# Patient Record
Sex: Female | Born: 1950 | Race: White | Hispanic: No | Marital: Single | Smoking: Former smoker
Health system: Southern US, Community
[De-identification: ages and names within clinical notes are randomized; demographics above are authoritative.]

## PROBLEM LIST (undated history)

## (undated) DIAGNOSIS — C4490 Unspecified malignant neoplasm of skin, unspecified: Secondary | ICD-10-CM

## (undated) DIAGNOSIS — I10 Essential (primary) hypertension: Secondary | ICD-10-CM

## (undated) DIAGNOSIS — T7840XA Allergy, unspecified, initial encounter: Secondary | ICD-10-CM

## (undated) DIAGNOSIS — M1990 Unspecified osteoarthritis, unspecified site: Secondary | ICD-10-CM

## (undated) HISTORY — DX: Allergy, unspecified, initial encounter: T78.40XA

## (undated) HISTORY — DX: Unspecified osteoarthritis, unspecified site: M19.90

## (undated) HISTORY — DX: Essential (primary) hypertension: I10

## (undated) HISTORY — PX: FRACTURE SURGERY: SHX138

## (undated) HISTORY — DX: Unspecified malignant neoplasm of skin, unspecified: C44.90

## (undated) HISTORY — PX: EYE SURGERY: SHX253

---

## 2010-09-22 HISTORY — PX: FOOT SURGERY: SHX648

## 2016-08-14 LAB — BASIC METABOLIC PANEL
Creatinine: 0.7 (ref <=1.1)
GLUCOSE: 95
POTASSIUM: 4.5 (ref 3.4–5.3)
Sodium: 145 (ref 137–147)

## 2016-08-14 LAB — LIPID PANEL
CHOLESTEROL: 218 — AB (ref 0–200)
HDL: 47 (ref 35–70)
LDL CALC: 137
Triglycerides: 169 — AB (ref 40–160)

## 2016-08-14 LAB — HEPATIC FUNCTION PANEL
ALT: 14 (ref 7–35)
AST: 16 (ref 13–35)
Bilirubin, Direct: 0.4

## 2017-07-09 DIAGNOSIS — M79672 Pain in left foot: Secondary | ICD-10-CM | POA: Diagnosis not present

## 2017-07-09 DIAGNOSIS — Z9889 Other specified postprocedural states: Secondary | ICD-10-CM | POA: Diagnosis not present

## 2017-07-28 ENCOUNTER — Encounter: Payer: Self-pay | Admitting: Family Medicine

## 2017-07-28 ENCOUNTER — Ambulatory Visit: Payer: PPO | Admitting: Family Medicine

## 2017-07-28 VITALS — BP 138/78 | HR 87 | Temp 98.1°F | Ht 66.0 in | Wt 182.3 lb

## 2017-07-28 DIAGNOSIS — L03211 Cellulitis of face: Secondary | ICD-10-CM | POA: Insufficient documentation

## 2017-07-28 DIAGNOSIS — I10 Essential (primary) hypertension: Secondary | ICD-10-CM | POA: Diagnosis not present

## 2017-07-28 DIAGNOSIS — M79673 Pain in unspecified foot: Secondary | ICD-10-CM | POA: Insufficient documentation

## 2017-07-28 DIAGNOSIS — Z8669 Personal history of other diseases of the nervous system and sense organs: Secondary | ICD-10-CM | POA: Diagnosis not present

## 2017-07-28 DIAGNOSIS — J302 Other seasonal allergic rhinitis: Secondary | ICD-10-CM | POA: Diagnosis not present

## 2017-07-28 DIAGNOSIS — Z0000 Encounter for general adult medical examination without abnormal findings: Secondary | ICD-10-CM | POA: Diagnosis not present

## 2017-07-28 DIAGNOSIS — Z131 Encounter for screening for diabetes mellitus: Secondary | ICD-10-CM

## 2017-07-28 DIAGNOSIS — Z13220 Encounter for screening for lipoid disorders: Secondary | ICD-10-CM | POA: Diagnosis not present

## 2017-07-28 DIAGNOSIS — L259 Unspecified contact dermatitis, unspecified cause: Secondary | ICD-10-CM | POA: Insufficient documentation

## 2017-07-28 LAB — COMPREHENSIVE METABOLIC PANEL
ALBUMIN: 4.3 g/dL (ref 3.5–5.2)
ALT: 15 U/L (ref 0–35)
AST: 16 U/L (ref 0–37)
Alkaline Phosphatase: 101 U/L (ref 39–117)
BILIRUBIN TOTAL: 0.4 mg/dL (ref 0.2–1.2)
BUN: 14 mg/dL (ref 6–23)
CALCIUM: 10.2 mg/dL (ref 8.4–10.5)
CO2: 28 mEq/L (ref 19–32)
Chloride: 104 mEq/L (ref 96–112)
Creatinine, Ser: 0.74 mg/dL (ref 0.40–1.20)
GFR: 83.67 mL/min (ref >60.00)
Glucose, Bld: 90 mg/dL (ref 70–99)
Potassium: 4.5 mEq/L (ref 3.5–5.1)
Sodium: 139 mEq/L (ref 135–145)
Total Protein: 7.2 g/dL (ref 6.0–8.3)

## 2017-07-28 LAB — CBC WITH DIFFERENTIAL/PLATELET
BASOS ABS: 0.1 10*3/uL (ref 0.0–0.1)
Basophils Relative: 1.4 % (ref 0.0–3.0)
EOS ABS: 0.1 10*3/uL (ref 0.0–0.7)
Eosinophils Relative: 1.7 % (ref 0.0–5.0)
HCT: 42.2 % (ref 36.0–46.0)
HEMOGLOBIN: 13.7 g/dL (ref 12.0–15.0)
LYMPHS PCT: 31.5 % (ref 12.0–46.0)
Lymphs Abs: 2.2 10*3/uL (ref 0.7–4.0)
MCHC: 32.5 g/dL (ref 30.0–36.0)
MCV: 85.6 fl (ref 78.0–100.0)
MONO ABS: 0.7 10*3/uL (ref 0.1–1.0)
Monocytes Relative: 9.5 % (ref 3.0–12.0)
Neutro Abs: 3.9 10*3/uL (ref 1.4–7.7)
Neutrophils Relative %: 55.9 % (ref 43.0–77.0)
Platelets: 258 10*3/uL (ref 150.0–400.0)
RBC: 4.93 Mil/uL (ref 3.87–5.11)
RDW: 17.6 % — ABNORMAL HIGH (ref 11.5–15.5)
WBC: 7 10*3/uL (ref 4.0–10.5)

## 2017-07-28 LAB — LIPID PANEL
CHOLESTEROL: 252 mg/dL — AB (ref 0–200)
HDL: 45.5 mg/dL (ref >39.00)
LDL CALC: 177 mg/dL — AB (ref 0–99)
NonHDL: 206.14
TRIGLYCERIDES: 145 mg/dL (ref 0.0–149.0)
Total CHOL/HDL Ratio: 6
VLDL: 29 mg/dL (ref 0.0–40.0)

## 2017-07-28 LAB — HEMOGLOBIN A1C: HEMOGLOBIN A1C: 5.7 % (ref 4.6–6.5)

## 2017-07-28 NOTE — Progress Notes (Addendum)
Subjective:    Maria Love is a 66 y.o. female who presents for a Welcome to Medicare exam.   Patient recently moved to [place] in mid-June from [place]. She is retired Energy manager. She is currently sharing apartment with one of her good friends that lives in the area. Patient is interested in joining [employer]. She inquires about deep water aerobics as she use to do this in [place].  HTN: -taking metoprolol -dx'd 8 yrs ago -not currently checking bp at home -denies HA, CP, changes in vision.  Seasonal allergies: -using a homeopathic sinus rinse (sinusalia?) and a tree pollen suspension in alcohol.  H/o migraines: -started in 1970s after a skull fx s/p kick in the head from a horse -have decreased in frequency.  Review of Systems General: Denies fever, chills, night sweats, changes in weight, changes in appetite HEENT: Denies headaches, ear pain, changes in vision, rhinorrhea, sore throat CV: Denies CP, palpitations, SOB, orthopnea Pulm: Denies SOB, cough, wheezing GI: Denies abdominal pain, nausea, vomiting, diarrhea, constipation GU: Denies dysuria, hematuria, frequency, vaginal discharge Msk: Denies muscle cramps, joint pains Neuro: Denies weakness, numbness, tingling Skin: Denies rashes, bruising Psych: Denies depression, anxiety, hallucinations         Objective:    Today's Vitals   07/28/17 1300  BP: 138/78  Pulse: 87  Temp: 98.1 F (36.7 C)  TempSrc: Oral  Weight: 182 lb 4.8 oz (82.7 kg)  Height: 5\' 6"  (1.676 m)  Body mass index is 29.42 kg/m.  Medications Outpatient Encounter Medications as of 07/28/2017  Medication Sig  . HAWTHORN PO Take by mouth.  . metoprolol succinate (TOPROL-XL) 25 MG 24 hr tablet   . Multiple Vitamin (MULTIVITAMIN) tablet Take 1 tablet daily by mouth.   No facility-administered encounter medications on file as of 07/28/2017.      History: Past Medical History:  Diagnosis Date  . Hypertension     History reviewed. No pertinent surgical history.  Family History  Problem Relation Age of Onset  . Arthritis Mother   . Hypertension Mother   . Hyperlipidemia Mother   . Mitral valve prolapse Mother   . Pancreatic cancer Father   . Arthritis Sister   . Thyroid cancer Son   . Arthritis Maternal Grandmother   . Hypertension Maternal Grandmother   . Diabetes Maternal Grandfather   . Arthritis Paternal Grandmother   . Macular degeneration Paternal Grandmother    Social History   Occupational History  . Not on file  Tobacco Use  . Smoking status: Former Research scientist (life sciences)  . Smokeless tobacco: Never Used  Substance and Sexual Activity  . Alcohol use: No    Frequency: Never  . Drug use: No  . Sexual activity: Not on file    Tobacco Counseling Counseling given: Pt is a former smoker.  Smoked for about 1 yr in college and has not smoked since.   Immunizations and Health Maintenance Immunization History  Administered Date(s) Administered  . Tdap 09/22/2008  . Zoster 09/23/2011   Health Maintenance Due  Topic Date Due  . Hepatitis C Screening  Mar 15, 1951  . HIV Screening  06/03/1967  . TETANUS/TDAP  06/03/1971  . PAP SMEAR  06/02/1973  . MAMMOGRAM  06/02/2002  . COLONOSCOPY  06/02/2002  . DEXA SCAN  06/02/2017    Activities of Daily Living No flowsheet data found.  Able to perform all  ADLs without assistance. Walks and takes the bus, as she sold her care before moving from [place].  Physical Exam  Gen. Pleasant, well developed, well-nourished, in NAD HEENT - [place]/AT, PERRL, no scleral icterus, no nasal drainage, pharynx without erythema or exudate. Neck: No JVD, no thyromegaly, no carotid bruits Lungs: no use of accessory muscles, CTAB, no wheezes, rales or rhonchi Cardiovascular: RRR, No r/g/m, no peripheral edema Abdomen: BS present, soft, nontender,nondistended Musculoskeletal: No deformities, moves all four extremities, no cyanosis or clubbing, normal tone Neuro:  A&Ox3,  CN II-XII intact, normal gait Skin:  Warm, dry, intact, no lesions Psych: normal affect, mood appropriate     (optional), or other factors deemed appropriate based on the beneficiary's medical and social history and current clinical standards.      Assessment:    This is a routine wellness examination for this patient .   Vision/Hearing screen No exam data present  Recent vision exam.  Had cataract removal on L eye in March 2018.  R eye was done Aug 2016.  Dietary issues and exercise activities discussed:  Yes.  Pt is planning to join [employer].  She is interested in doing deep water aerobics.  She is currently walking more and taking the bus.  Pt endorses healthy eating habits.    Goals    None     Depression Screen PHQ 2/9 Scores 07/28/2017  PHQ - 2 Score 0     Fall Risk Fall Risk  07/28/2017  Falls in the past year? No    Cognitive Function:  appropriate, no issues.      Patient Care Team: Billie Ruddy, MD as PCP - General (Family Medicine)     Plan:   Pt is a 66 yo female with pmh sig HTN and seasonal allergies.  I have personally reviewed and noted the following in the patient's chart:   . Medical and social history . Use of alcohol, tobacco or illicit drugs  . Current medications and supplements . Functional ability and status . Nutritional status . Physical activity . Advanced directives . List of other physicians . Hospitalizations, surgeries, and ER visits in previous 12 months . Vitals . Screenings to include cognitive, depression, and falls . Referrals and appointments  In addition, I have reviewed and discussed with patient certain preventive protocols, quality metrics, and best practice recommendations. A written personalized care plan for preventive services as well as general preventive health recommendations were provided to patient.   Will send to lab for CBC, CMP, Hgb A1C, Lipid panel. Given handout to schedule  mammogram.  Billie Ruddy, MD 07/28/2017

## 2017-07-28 NOTE — Patient Instructions (Addendum)
Preventive Care 65 Years and Older, Female Preventive care refers to lifestyle choices and visits with your health care provider that can promote health and wellness. What does preventive care include?  A yearly physical exam. This is also called an annual well check.  Dental exams once or twice a year.  Routine eye exams. Ask your health care provider how often you should have your eyes checked.  Personal lifestyle choices, including: ? Daily care of your teeth and gums. ? Regular physical activity. ? Eating a healthy diet. ? Avoiding tobacco and drug use. ? Limiting alcohol use. ? Practicing safe sex. ? Taking low-dose aspirin every day. ? Taking vitamin and mineral supplements as recommended by your health care provider. What happens during an annual well check? The services and screenings done by your health care provider during your annual well check will depend on your age, overall health, lifestyle risk factors, and family history of disease. Counseling Your health care provider may ask you questions about your:  Alcohol use.  Tobacco use.  Drug use.  Emotional well-being.  Home and relationship well-being.  Sexual activity.  Eating habits.  History of falls.  Memory and ability to understand (cognition).  Work and work environment.  Reproductive health.  Screening You may have the following tests or measurements:  Height, weight, and BMI.  Blood pressure.  Lipid and cholesterol levels. These may be checked every 5 years, or more frequently if you are over 50 years old.  Skin check.  Lung cancer screening. You may have this screening every year starting at age 55 if you have a 30-pack-year history of smoking and currently smoke or have quit within the past 15 years.  Fecal occult blood test (FOBT) of the stool. You may have this test every year starting at age 50.  Flexible sigmoidoscopy or colonoscopy. You may have a sigmoidoscopy every 5 years or  a colonoscopy every 10 years starting at age 50.  Hepatitis C blood test.  Hepatitis B blood test.  Sexually transmitted disease (STD) testing.  Diabetes screening. This is done by checking your blood sugar (glucose) after you have not eaten for a while (fasting). You may have this done every 1-3 years.  Bone density scan. This is done to screen for osteoporosis. You may have this done starting at age 65.  Mammogram. This may be done every 1-2 years. Talk to your health care provider about how often you should have regular mammograms.  Talk with your health care provider about your test results, treatment options, and if necessary, the need for more tests. Vaccines Your health care provider may recommend certain vaccines, such as:  Influenza vaccine. This is recommended every year.  Tetanus, diphtheria, and acellular pertussis (Tdap, Td) vaccine. You may need a Td booster every 10 years.  Varicella vaccine. You may need this if you have not been vaccinated.  Zoster vaccine. You may need this after age 60.  Measles, mumps, and rubella (MMR) vaccine. You may need at least one dose of MMR if you were born in 1957 or later. You may also need a second dose.  Pneumococcal 13-valent conjugate (PCV13) vaccine. One dose is recommended after age 65.  Pneumococcal polysaccharide (PPSV23) vaccine. One dose is recommended after age 65.  Meningococcal vaccine. You may need this if you have certain conditions.  Hepatitis A vaccine. You may need this if you have certain conditions or if you travel or work in places where you may be exposed to hepatitis   A.  Hepatitis B vaccine. You may need this if you have certain conditions or if you travel or work in places where you may be exposed to hepatitis B.  Haemophilus influenzae type b (Hib) vaccine. You may need this if you have certain conditions.  Talk to your health care provider about which screenings and vaccines you need and how often you  need them. This information is not intended to replace advice given to you by your health care provider. Make sure you discuss any questions you have with your health care provider. Document Released: 10/05/2015 Document Revised: 05/28/2016 Document Reviewed: 07/10/2015 Elsevier Interactive Patient Education  2017 [employer].

## 2017-08-11 ENCOUNTER — Encounter: Payer: Self-pay | Admitting: Family Medicine

## 2017-08-17 NOTE — Progress Notes (Addendum)
error 

## 2017-09-04 ENCOUNTER — Telehealth: Payer: Self-pay | Admitting: Family Medicine

## 2017-09-04 ENCOUNTER — Other Ambulatory Visit: Payer: Self-pay | Admitting: Emergency Medicine

## 2017-09-04 ENCOUNTER — Other Ambulatory Visit: Payer: Self-pay

## 2017-09-04 MED ORDER — METOPROLOL SUCCINATE ER 25 MG PO TB24: 25.0000 mg | ORAL_TABLET | Freq: Every day | ORAL | 1 refills | Status: DC

## 2017-09-04 NOTE — Telephone Encounter (Signed)
Medication has been sent to patient pharmacy. Nothing further needed.

## 2017-09-04 NOTE — Telephone Encounter (Signed)
That's fine

## 2017-09-04 NOTE — Telephone Encounter (Signed)
Toprol XL needs refill. This has not been refilled before by this office. Will send to provider.

## 2017-09-04 NOTE — Telephone Encounter (Signed)
Is it okay to refill patient metoprolol?

## 2017-09-04 NOTE — Telephone Encounter (Signed)
Copied from [place]. Topic: Quick Communication - Rx Refill/Question >> Sep 04, 2017  9:18 AM Synthia Innocent wrote: Has the patient contacted their pharmacy? Yes.     (Agent: If no, request that the patient contact the pharmacy for the refill.)   Preferred Pharmacy (with phone number or street name):[facility] on [address]   Agent: Please be advised that RX refills may take up to 3 business days. We ask that you follow-up with your pharmacy. Requesting refill on metoprolol succinate (TOPROL-XL) 25 MG 24 hr tablet

## 2018-02-24 DIAGNOSIS — M25572 Pain in left ankle and joints of left foot: Secondary | ICD-10-CM | POA: Diagnosis not present

## 2018-03-10 DIAGNOSIS — M25572 Pain in left ankle and joints of left foot: Secondary | ICD-10-CM | POA: Diagnosis not present

## 2018-03-12 ENCOUNTER — Telehealth: Payer: Self-pay | Admitting: Family Medicine

## 2018-03-12 MED ORDER — METOPROLOL SUCCINATE ER 25 MG PO TB24: 25.0000 mg | ORAL_TABLET | Freq: Every day | ORAL | 1 refills | Status: AC

## 2018-03-12 NOTE — Telephone Encounter (Signed)
Copied from [place] [id number]. Topic: Quick Communication - Rx Refill/Question >> Mar 12, 2018  8:13 AM Burchel, Abbi R wrote: Medication: metoprolol succinate (TOPROL-XL) 25 MG 24 hr tablet  Has the patient contacted their pharmacy? No.   Preferred Pharmacy (with phone number or street name): [place], [place] - [address]  [phone] (Phone) [phone] (Fax)    Pt states her Rx bottle shows no refills and would like to have it renewed.

## 2018-03-15 DIAGNOSIS — M79672 Pain in left foot: Secondary | ICD-10-CM | POA: Diagnosis not present

## 2018-04-22 DIAGNOSIS — H524 Presbyopia: Secondary | ICD-10-CM | POA: Diagnosis not present

## 2018-05-11 DIAGNOSIS — M25572 Pain in left ankle and joints of left foot: Secondary | ICD-10-CM | POA: Diagnosis not present

## 2018-05-11 DIAGNOSIS — M79672 Pain in left foot: Secondary | ICD-10-CM | POA: Diagnosis not present

## 2018-05-18 DIAGNOSIS — M6281 Muscle weakness (generalized): Secondary | ICD-10-CM | POA: Diagnosis not present

## 2018-05-18 DIAGNOSIS — M25672 Stiffness of left ankle, not elsewhere classified: Secondary | ICD-10-CM | POA: Diagnosis not present

## 2018-05-20 DIAGNOSIS — M6281 Muscle weakness (generalized): Secondary | ICD-10-CM | POA: Diagnosis not present

## 2018-05-20 DIAGNOSIS — M25672 Stiffness of left ankle, not elsewhere classified: Secondary | ICD-10-CM | POA: Diagnosis not present

## 2018-05-26 DIAGNOSIS — M25672 Stiffness of left ankle, not elsewhere classified: Secondary | ICD-10-CM | POA: Diagnosis not present

## 2018-05-26 DIAGNOSIS — M6281 Muscle weakness (generalized): Secondary | ICD-10-CM | POA: Diagnosis not present

## 2018-05-28 DIAGNOSIS — M6281 Muscle weakness (generalized): Secondary | ICD-10-CM | POA: Diagnosis not present

## 2018-05-28 DIAGNOSIS — M25672 Stiffness of left ankle, not elsewhere classified: Secondary | ICD-10-CM | POA: Diagnosis not present

## 2018-05-31 DIAGNOSIS — M25672 Stiffness of left ankle, not elsewhere classified: Secondary | ICD-10-CM | POA: Diagnosis not present

## 2018-05-31 DIAGNOSIS — M6281 Muscle weakness (generalized): Secondary | ICD-10-CM | POA: Diagnosis not present

## 2018-06-03 DIAGNOSIS — M25672 Stiffness of left ankle, not elsewhere classified: Secondary | ICD-10-CM | POA: Diagnosis not present

## 2018-06-03 DIAGNOSIS — M6281 Muscle weakness (generalized): Secondary | ICD-10-CM | POA: Diagnosis not present

## 2018-06-07 DIAGNOSIS — M6281 Muscle weakness (generalized): Secondary | ICD-10-CM | POA: Diagnosis not present

## 2018-06-07 DIAGNOSIS — M25672 Stiffness of left ankle, not elsewhere classified: Secondary | ICD-10-CM | POA: Diagnosis not present

## 2018-06-09 DIAGNOSIS — M25672 Stiffness of left ankle, not elsewhere classified: Secondary | ICD-10-CM | POA: Diagnosis not present

## 2018-06-09 DIAGNOSIS — M6281 Muscle weakness (generalized): Secondary | ICD-10-CM | POA: Diagnosis not present

## 2018-07-27 ENCOUNTER — Telehealth: Payer: Self-pay | Admitting: *Deleted

## 2018-07-27 NOTE — Telephone Encounter (Signed)
Copied from [place] [phone]. Topic: Medicare AWV >> Jul 27, 2018  4:00 PM Bea Graff, NT wrote: Reason for CRM: Pt calling to schedule her AWV. Please advise.

## 2018-07-28 NOTE — Telephone Encounter (Signed)
Call to ms. Potenza to schedule a Wellness visit. Left VM and my direct line [phone] and left message that I only work M-Tuesday - Wed. Wynetta Fines RN

## 2018-10-05 ENCOUNTER — Telehealth: Payer: Self-pay | Admitting: Family Medicine

## 2018-10-05 NOTE — Telephone Encounter (Signed)
Spoke with Ms. Maria Love and she states she is seeing a new PCP, Dr. Willey Blade with [facility].

## 2018-10-05 NOTE — Telephone Encounter (Signed)
Called to schedule Medicare Annual Wellness Visit with the Nurse Health Advisor.  No Answer.  Left message on answer machine to call Lattie Haw with [facility] at [phone]. Need to find out if she is using [facility] for PCP, or Dr. Grier Mitts, at [facility].  If she is using [facility], please schedule AWV-s after 07/28/2017 AND will need a New Patient appointment with [facility], as well.

## 2018-10-06 DIAGNOSIS — J309 Allergic rhinitis, unspecified: Secondary | ICD-10-CM | POA: Diagnosis not present

## 2018-10-06 DIAGNOSIS — R7309 Other abnormal glucose: Secondary | ICD-10-CM | POA: Diagnosis not present

## 2018-10-06 DIAGNOSIS — I10 Essential (primary) hypertension: Secondary | ICD-10-CM | POA: Diagnosis not present

## 2018-10-06 DIAGNOSIS — E663 Overweight: Secondary | ICD-10-CM | POA: Diagnosis not present

## 2018-10-06 DIAGNOSIS — E782 Mixed hyperlipidemia: Secondary | ICD-10-CM | POA: Diagnosis not present

## 2018-10-07 DIAGNOSIS — R7309 Other abnormal glucose: Secondary | ICD-10-CM | POA: Diagnosis not present

## 2018-10-07 DIAGNOSIS — J309 Allergic rhinitis, unspecified: Secondary | ICD-10-CM | POA: Diagnosis not present

## 2018-10-07 DIAGNOSIS — E782 Mixed hyperlipidemia: Secondary | ICD-10-CM | POA: Diagnosis not present

## 2018-10-07 DIAGNOSIS — I10 Essential (primary) hypertension: Secondary | ICD-10-CM | POA: Diagnosis not present

## 2018-11-17 DIAGNOSIS — E782 Mixed hyperlipidemia: Secondary | ICD-10-CM | POA: Diagnosis not present

## 2018-11-17 DIAGNOSIS — N390 Urinary tract infection, site not specified: Secondary | ICD-10-CM | POA: Diagnosis not present

## 2018-11-17 DIAGNOSIS — Z0000 Encounter for general adult medical examination without abnormal findings: Secondary | ICD-10-CM | POA: Diagnosis not present

## 2018-11-17 DIAGNOSIS — I10 Essential (primary) hypertension: Secondary | ICD-10-CM | POA: Diagnosis not present

## 2018-12-31 DIAGNOSIS — I442 Atrioventricular block, complete: Secondary | ICD-10-CM | POA: Diagnosis not present

## 2018-12-31 DIAGNOSIS — S81811D Laceration without foreign body, right lower leg, subsequent encounter: Secondary | ICD-10-CM | POA: Diagnosis not present

## 2018-12-31 DIAGNOSIS — L03115 Cellulitis of right lower limb: Secondary | ICD-10-CM | POA: Diagnosis not present

## 2018-12-31 DIAGNOSIS — H43819 Vitreous degeneration, unspecified eye: Secondary | ICD-10-CM | POA: Diagnosis not present

## 2018-12-31 DIAGNOSIS — L089 Local infection of the skin and subcutaneous tissue, unspecified: Secondary | ICD-10-CM | POA: Diagnosis not present

## 2019-04-25 ENCOUNTER — Other Ambulatory Visit: Payer: Self-pay

## 2019-04-29 DIAGNOSIS — H524 Presbyopia: Secondary | ICD-10-CM | POA: Diagnosis not present

## 2019-05-18 DIAGNOSIS — R55 Syncope and collapse: Secondary | ICD-10-CM | POA: Diagnosis not present

## 2019-05-18 DIAGNOSIS — Z20828 Contact with and (suspected) exposure to other viral communicable diseases: Secondary | ICD-10-CM | POA: Diagnosis not present

## 2019-05-18 DIAGNOSIS — I10 Essential (primary) hypertension: Secondary | ICD-10-CM | POA: Diagnosis not present

## 2019-05-24 ENCOUNTER — Other Ambulatory Visit: Payer: Self-pay | Admitting: Internal Medicine

## 2019-05-24 DIAGNOSIS — R55 Syncope and collapse: Secondary | ICD-10-CM

## 2019-05-31 ENCOUNTER — Ambulatory Visit
Admission: RE | Admit: 2019-05-31 | Discharge: 2019-05-31 | Disposition: A | Discharge status: Home / Self Care | Payer: PPO | Source: Ambulatory Visit | Attending: Internal Medicine | Admitting: Internal Medicine

## 2019-05-31 DIAGNOSIS — R55 Syncope and collapse: Secondary | ICD-10-CM | POA: Diagnosis not present

## 2019-05-31 IMAGING — CT CT HEAD WO/W CM
2 of 3 series · 15 of 40 positions shown, 18 images · IV contrast (iopamidol)
Comparison: No pertinent prior studies available for comparison.

CLINICAL DATA: Syncope and collapse. Additional history provided:
Evaluate for continued pain after a fall in [DATE].

Creatinine was obtained on site at [HOSPITAL] at [HOSPITAL].
Results: Creatinine 0.7 mg/dL (GFR 76).
EXAM:
CT HEAD WITHOUT AND WITH CONTRAST
TECHNIQUE: Contiguous axial images were obtained from the base of the skull
through the vertex without and with intravenous contrast
CONTRAST:  75mL [UW] IOPAMIDOL ([UW]) INJECTION 61%

[Series 2: head w/(date) · axial · 0.49mm/px · z∈[-87,+43]mm · 12 of 32 slices shown, 15 images]
[im 3/32  brain]
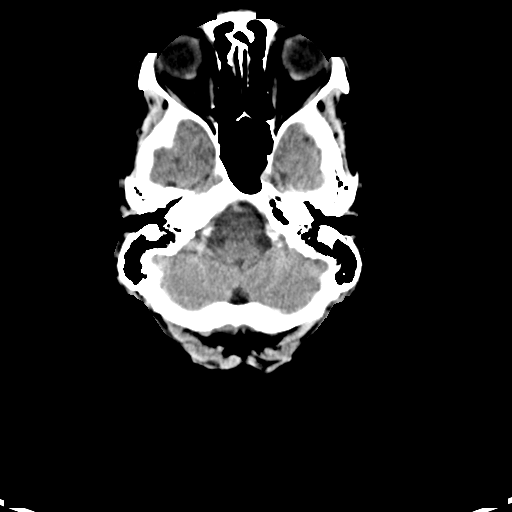
[im 3/32  bone]
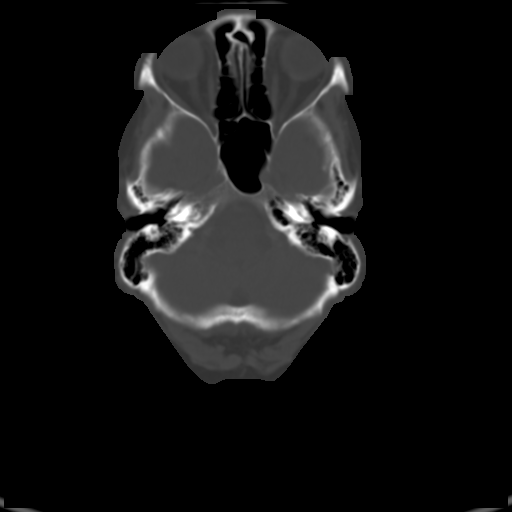
[im 5/32  brain]
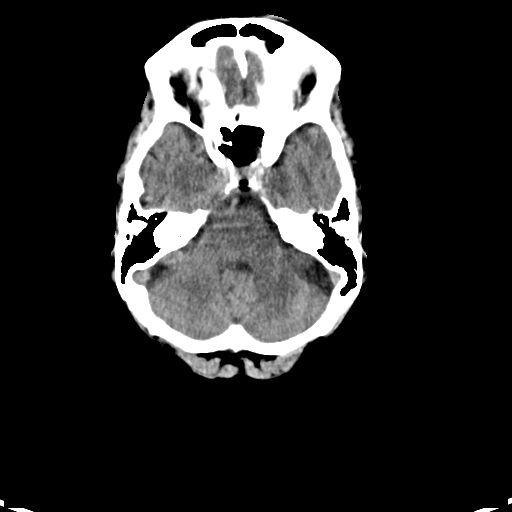
[im 7/32  brain]
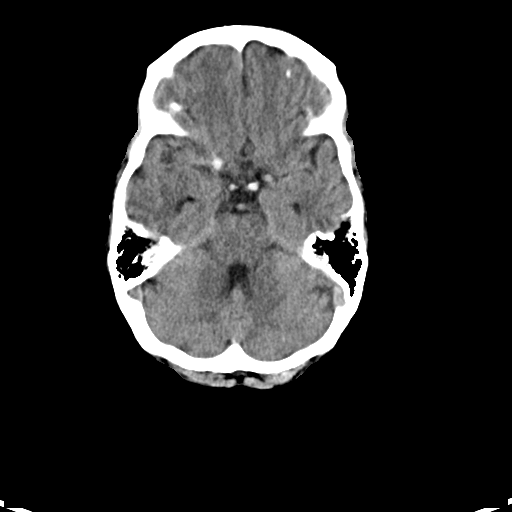
[im 9/32  brain]
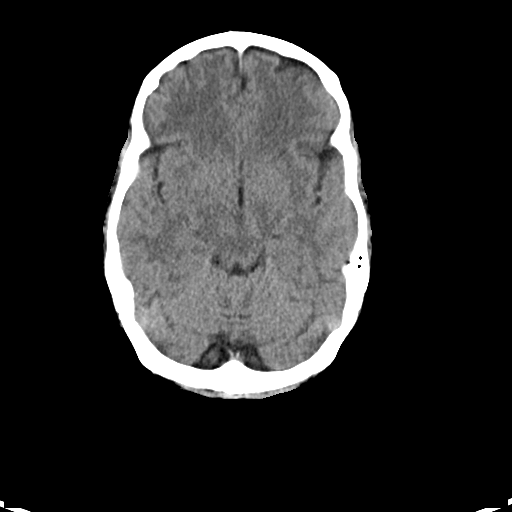
[im 12/32  brain]
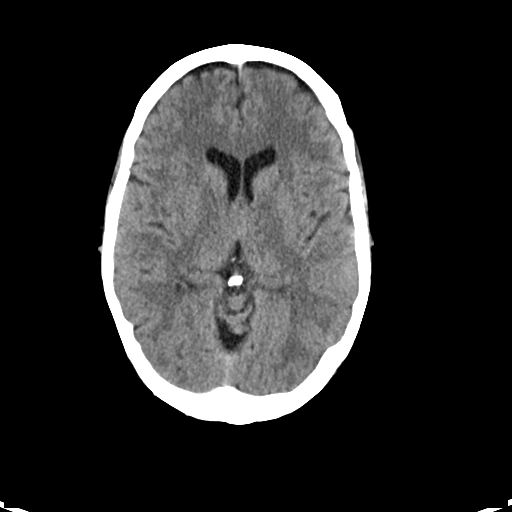
[im 12/32  bone]
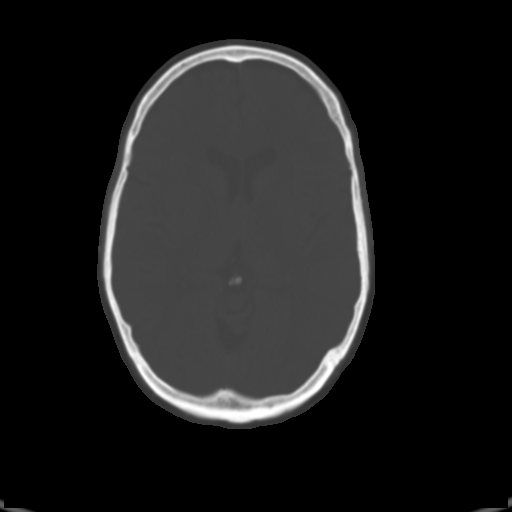
[im 14/32  brain]
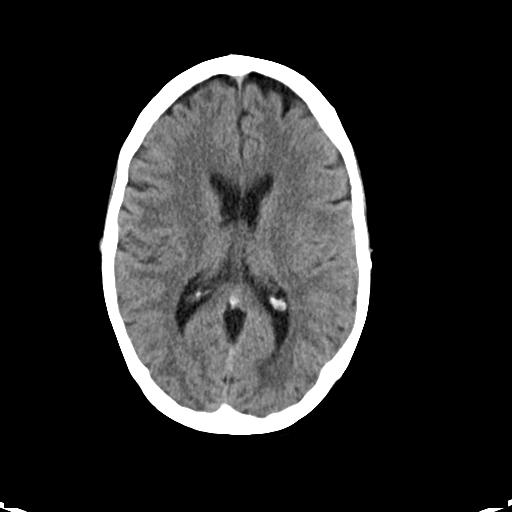
[im 18/32  brain]
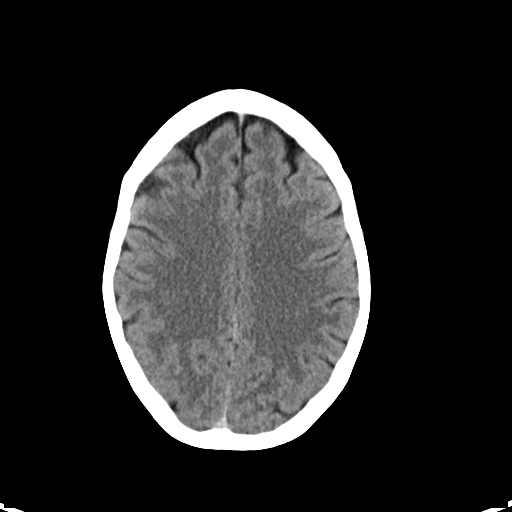
[im 20/32  brain]
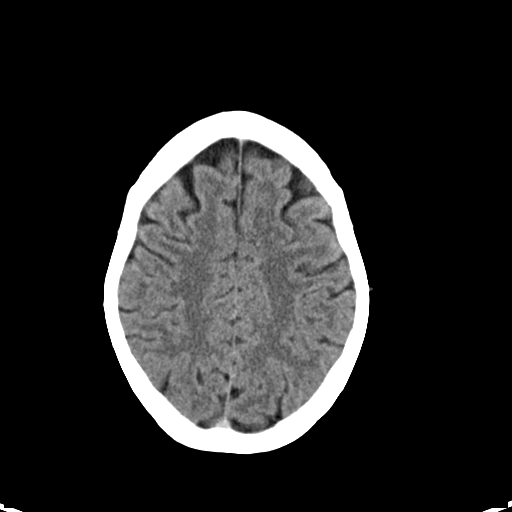
[im 23/32  brain]
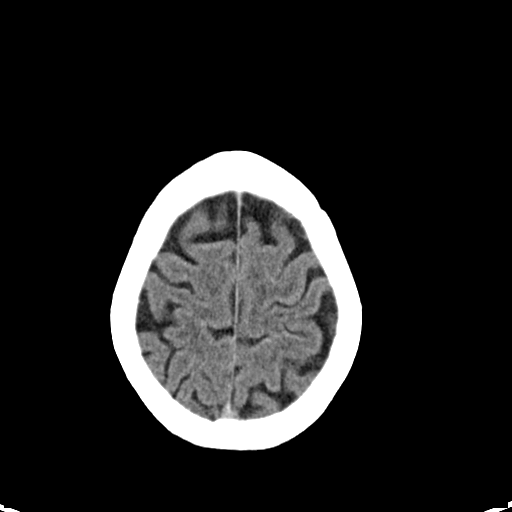
[im 23/32  bone]
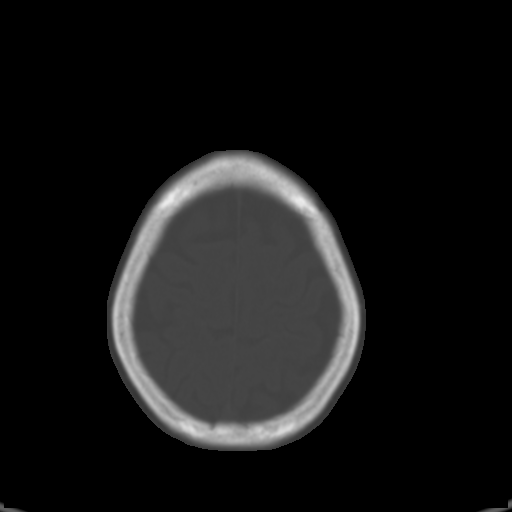
[im 25/32  brain]
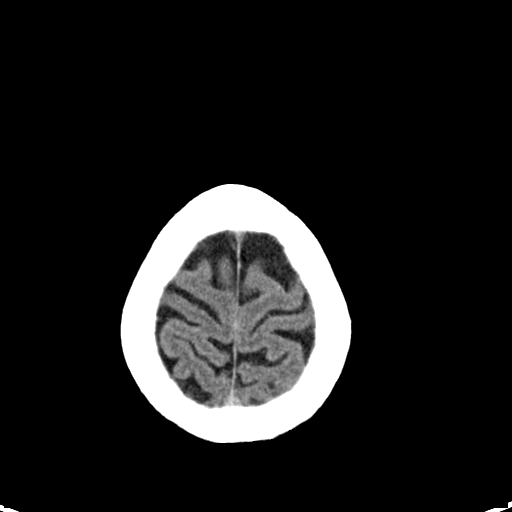
[im 27/32  brain]
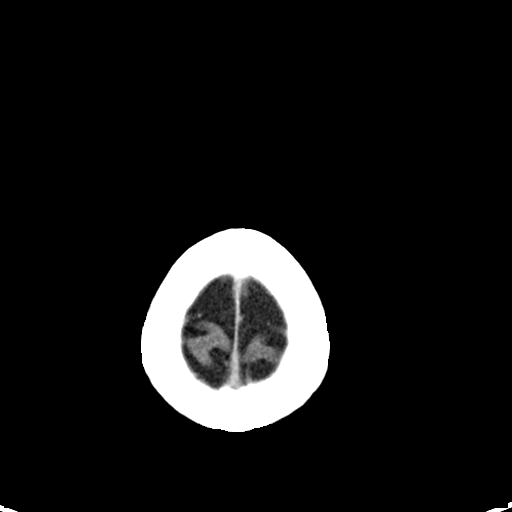
[im 29/32  brain]
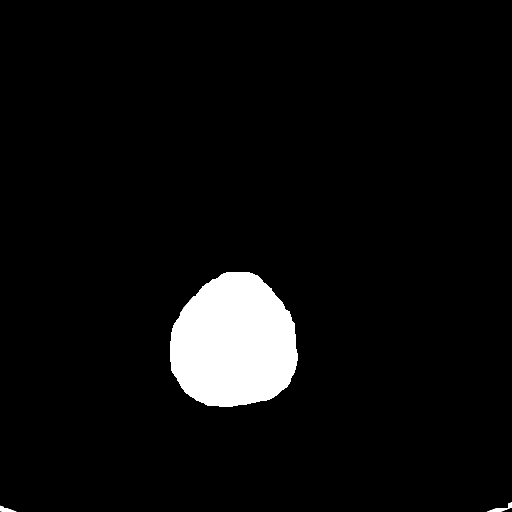

[Series 5: cor cor · coronal · 0.27mm/px · 3 of 66 slices shown]
[im 23/66  brain]
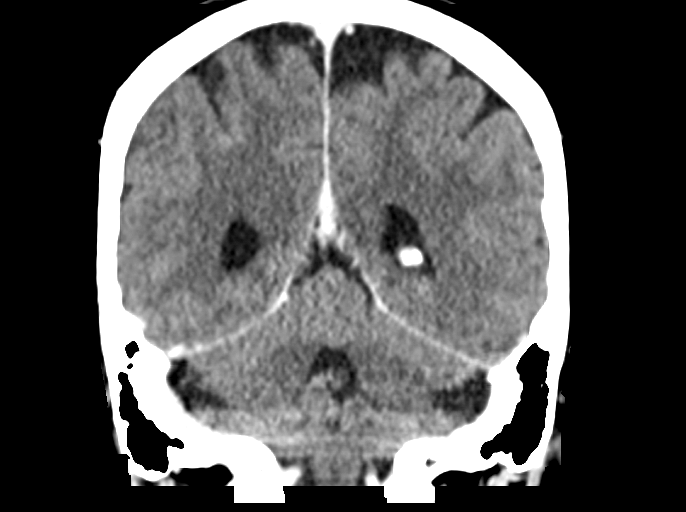
[im 30/66  brain]
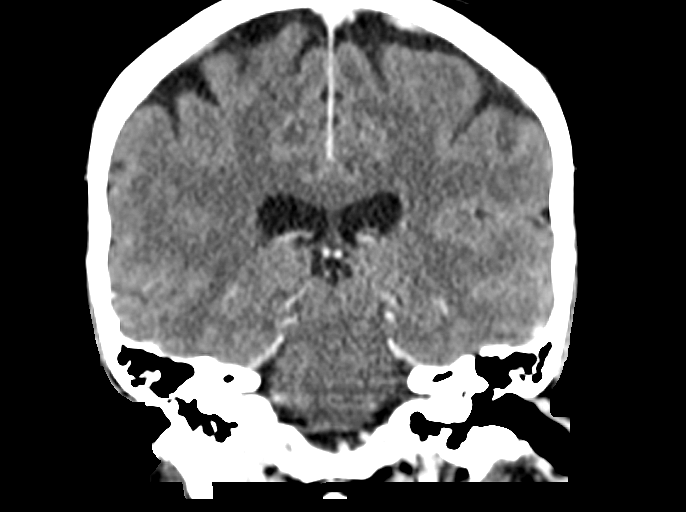
[im 36/66  brain]
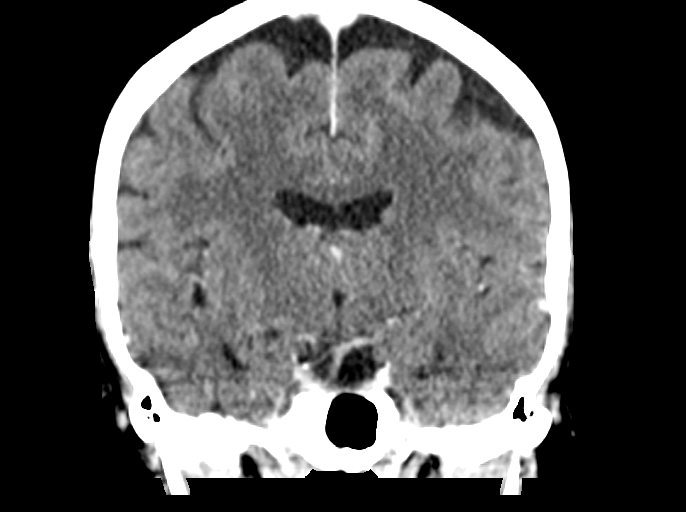

[15 of 40 positions shown; findings below may reference images not displayed]

FINDINGS: Brain: There is no acute intracranial hemorrhage or demarcated
cortical infarction. No evidence of intracranial mass. No midline
shift or extra-axial fluid collection. No abnormal intracranial
enhancement is identified. Parenchymal volume is normal for age.

Vascular: No hyperdense vessel. Expected enhancement within the
proximal large arterial vessels.

Skull: No calvarial fracture or suspicious osseous lesion.

Sinuses/Orbits: No significant paranasal sinus disease or mastoid
effusion at the imaged levels. Imaged orbits demonstrate no acute
abnormality.
IMPRESSION: Unremarkable CT of the head with and without contrast for age.

No evidence of acute intracranial abnormality.

## 2019-05-31 MED ORDER — IOPAMIDOL (ISOVUE-300) INJECTION 61%
75.0000 mL | Freq: Once | INTRAVENOUS | Status: AC | PRN
  Administered 2019-05-31: 75 mL via INTRAVENOUS

## 2019-08-03 DIAGNOSIS — Z20828 Contact with and (suspected) exposure to other viral communicable diseases: Secondary | ICD-10-CM | POA: Diagnosis not present

## 2019-11-30 DIAGNOSIS — E559 Vitamin D deficiency, unspecified: Secondary | ICD-10-CM | POA: Diagnosis not present

## 2019-11-30 DIAGNOSIS — I10 Essential (primary) hypertension: Secondary | ICD-10-CM | POA: Diagnosis not present

## 2019-11-30 DIAGNOSIS — E663 Overweight: Secondary | ICD-10-CM | POA: Diagnosis not present

## 2019-11-30 DIAGNOSIS — Z0000 Encounter for general adult medical examination without abnormal findings: Secondary | ICD-10-CM | POA: Diagnosis not present

## 2019-11-30 DIAGNOSIS — E782 Mixed hyperlipidemia: Secondary | ICD-10-CM | POA: Diagnosis not present

## 2020-05-17 DIAGNOSIS — H524 Presbyopia: Secondary | ICD-10-CM | POA: Diagnosis not present

## 2020-06-05 DIAGNOSIS — I10 Essential (primary) hypertension: Secondary | ICD-10-CM | POA: Diagnosis not present

## 2020-06-05 DIAGNOSIS — Z6829 Body mass index (BMI) 29.0-29.9, adult: Secondary | ICD-10-CM | POA: Diagnosis not present

## 2020-09-26 DIAGNOSIS — M79671 Pain in right foot: Secondary | ICD-10-CM | POA: Diagnosis not present

## 2020-12-03 DIAGNOSIS — Z0000 Encounter for general adult medical examination without abnormal findings: Secondary | ICD-10-CM | POA: Diagnosis not present

## 2020-12-03 DIAGNOSIS — I10 Essential (primary) hypertension: Secondary | ICD-10-CM | POA: Diagnosis not present

## 2020-12-03 DIAGNOSIS — R7309 Other abnormal glucose: Secondary | ICD-10-CM | POA: Diagnosis not present

## 2020-12-03 DIAGNOSIS — Z6827 Body mass index (BMI) 27.0-27.9, adult: Secondary | ICD-10-CM | POA: Diagnosis not present

## 2020-12-03 DIAGNOSIS — Z23 Encounter for immunization: Secondary | ICD-10-CM | POA: Diagnosis not present

## 2020-12-03 DIAGNOSIS — E782 Mixed hyperlipidemia: Secondary | ICD-10-CM | POA: Diagnosis not present

## 2020-12-03 DIAGNOSIS — E559 Vitamin D deficiency, unspecified: Secondary | ICD-10-CM | POA: Diagnosis not present

## 2021-01-31 ENCOUNTER — Encounter: Payer: Self-pay | Admitting: Cardiology

## 2021-01-31 ENCOUNTER — Ambulatory Visit: Payer: Medicare Other | Admitting: Cardiology

## 2021-01-31 ENCOUNTER — Other Ambulatory Visit: Payer: Self-pay

## 2021-01-31 VITALS — BP 151/87 | HR 96 | Temp 97.1°F | Resp 17 | Ht 66.0 in | Wt 169.0 lb

## 2021-01-31 DIAGNOSIS — R739 Hyperglycemia, unspecified: Secondary | ICD-10-CM

## 2021-01-31 DIAGNOSIS — E7800 Pure hypercholesterolemia, unspecified: Secondary | ICD-10-CM

## 2021-01-31 DIAGNOSIS — I10 Essential (primary) hypertension: Secondary | ICD-10-CM

## 2021-01-31 DIAGNOSIS — R9431 Abnormal electrocardiogram [ECG] [EKG]: Secondary | ICD-10-CM

## 2021-01-31 NOTE — Progress Notes (Signed)
Primary Physician/Referring:  Willey Blade, MD  Patient ID: Maria Love, female    DOB: Feb 01, 1951, 70 y.o.   MRN: [id number]  Chief Complaint  Patient presents with  . New Patient (Initial Visit)  . Hyperlipidemia    Ref by Dr. Karlton Lemon   HPI:    Maria Love  is a 70 y.o.  Caucasian female who is very active, walks for at least 4 miles a day, does deep water aerobics equaling to 5 miles a day for 3 times a week, also does stationary bicycle for 30 minutes daily with no symptoms of chest pain or dyspnea, referred to me for evaluation of abnormal EKG.  Past medical history significant for hypertension and hyperlipidemia.  She is concerned about abnormal EKG.  She brings her office labs and EKG with her.  No chest pain, dizziness, syncope, claudication or TIA-like symptoms.  Past Medical History:  Diagnosis Date  . Hypertension    History reviewed. No pertinent surgical history. Family History  Problem Relation Age of Onset  . Arthritis Mother   . Hypertension Mother   . Hyperlipidemia Mother   . Mitral valve prolapse Mother   . Pancreatic cancer Father   . Arthritis Sister   . Thyroid cancer Son   . Arthritis Maternal Grandmother   . Hypertension Maternal Grandmother   . Diabetes Maternal Grandfather   . Arthritis Paternal Grandmother   . Macular degeneration Paternal Grandmother     Social History   Tobacco Use  . Smoking status: Former Smoker    Packs/day: 0.25    Years: 1.00    Pack years: 0.25    Types: Cigarettes    Quit date: 01/31/1973    Years since quitting: 48.0  . Smokeless tobacco: Never Used  Substance Use Topics  . Alcohol use: No   Marital Status: Single  ROS  Review of Systems  Cardiovascular: Negative for chest pain, dyspnea on exertion and leg swelling.  Gastrointestinal: Negative for melena.   Objective  Blood pressure (!) 151/87, pulse 96, temperature (!) 97.1 F (36.2 C), temperature source Temporal, resp. rate 17,  height $Remov'5\' 6"'UFqNHh$  (1.676 m), weight 169 lb (76.7 kg), SpO2 98 %. Body mass index is 27.28 kg/m.  Vitals with BMI 01/31/2021 01/31/2021 07/28/2017  Height - $Remove'5\' 6"'wPkjPhZ$  $RemoveB'5\' 6"'ZWuBoiVQ$   Weight - 169 lbs 182 lbs 5 oz  BMI - 26.37 85.88  Systolic 502 774 128  Diastolic 87 84 78  Pulse 96 99 87     Physical Exam  Constitutional: No distress.  Eyes: Conjunctivae are normal.  Neck: No JVD present.  Cardiovascular: Regular rhythm, normal heart sounds, intact distal pulses and normal pulses. Exam reveals no gallop.  No murmur heard. Pulmonary/Chest: Effort normal and breath sounds normal. She exhibits no tenderness.  Abdominal: Soft. Bowel sounds are normal.  Musculoskeletal:        General: No edema. Normal Love of motion.     Cervical back: Neck supple.  Neurological: She is alert and oriented to person, place, and time.  Skin: Skin is warm.     Laboratory examination:   No results for input(s): NA, K, CL, CO2, GLUCOSE, BUN, CREATININE, CALCIUM, GFRNONAA, GFRAA in the last 8760 hours. CrCl cannot be calculated (Patient's most recent lab result is older than the maximum 21 days allowed.).  CMP Latest Ref Rng & Units 07/28/2017 08/14/2016  Glucose 70 - 99 mg/dL 90 -  BUN 6 - 23 mg/dL 14 -  Creatinine 0.40 - 1.20  mg/dL 8.78 0.7  Sodium 888 - 145 mEq/L 139 145  Potassium 3.5 - 5.1 mEq/L 4.5 4.5  Chloride 96 - 112 mEq/L 104 -  CO2 19 - 32 mEq/L 28 -  Calcium 8.4 - 10.5 mg/dL 38.9 -  Total Protein 6.0 - 8.3 g/dL 7.2 -  Total Bilirubin 0.2 - 1.2 mg/dL 0.4 -  Alkaline Phos 39 - 117 U/L 101 -  AST 0 - 37 U/L 16 16  ALT 0 - 35 U/L 15 14   CBC Latest Ref Rng & Units 07/28/2017  WBC 4.0 - 10.5 K/uL 7.0  Hemoglobin 12.0 - 15.0 g/dL 53.6  Hematocrit 15.3 - 46.0 % 42.2  Platelets 150.0 - 400.0 K/uL 258.0    Lipid Panel No results for input(s): CHOL, TRIG, LDLCALC, VLDL, HDL, CHOLHDL, LDLDIRECT in the last 8760 hours. Lipid Panel     Component Value Date/Time   CHOL 252 (H) 07/28/2017 1357   TRIG 145.0  07/28/2017 1357   HDL 45.50 07/28/2017 1357   CHOLHDL 6 07/28/2017 1357   VLDL 29.0 07/28/2017 1357   LDLCALC 177 (H) 07/28/2017 1357     HEMOGLOBIN A1C Lab Results  Component Value Date   HGBA1C 5.7 07/28/2017   TSH No results for input(s): TSH in the last 8760 hours.  External labs:   Labs 12/03/2020:  LDL particle number markedly elevated at 1591 (improved from 1986 on 10/07/2018).  Total cholesterol 217, triglycerides 164, HDL 46, LDL 141.  LP-IR score 69, elevated.  Apo B/A- ratio 0.9, mildly elevated.  LP-PLA-2 is normal at 153.  CRP 1.47.    Serum glucose 1 1 1  mg, BUN 12, creatinine 0.86, EGFR 74 mL, potassium 4.3, CMP otherwise normal.  Hb 15.1/HCT 43.9, platelets 272.  A1c 5.7%.  TSH normal at 1.220.  Vitamin D66.7.  Medications and allergies   Allergies  Allergen Reactions  . Antihistamines, Chlorpheniramine-Type      Current Outpatient Medications on File Prior to Visit  Medication Sig Dispense Refill  . Coenzyme Q10 (COQ10) 100 MG CAPS Take 1 capsule by mouth daily.    . metoprolol succinate (TOPROL-XL) 25 MG 24 hr tablet Take 1 tablet (25 mg total) by mouth daily. 90 tablet 1  . NON FORMULARY Take 1 capsule by mouth daily. Vitamin K2 D3    . Red Yeast Rice 600 MG CAPS Take 2 capsules by mouth daily.     No current facility-administered medications on file prior to visit.     Radiology:   No results found.  Cardiac Studies:   Outside records: ABI 12/03/2020: Normal at 1.03 right, 1.27 left.  EKG:     EKG 01/31/2021: Normal sinus rhythm at rate of 99 bpm, normal axis, anteroseptal infarct old.  No evidence of ischemia.   No significant change from 12/03/2020.  Assessment     ICD-10-CM   1. Hypertension, unspecified type  I10 EKG 12-Lead     Patient's cardiovascular risk history includes: LDL goal is under 100 [place] 10-year risk 10-20%   Medications Discontinued During This Encounter  Medication Reason  . Multiple Vitamin  (MULTIVITAMIN) tablet Error  . HAWTHORN PO Error    No orders of the defined types were placed in this encounter.  Orders Placed This Encounter  Procedures  . EKG 12-Lead   Recommendations:   Maria Love is a 70 y.o. Caucasian female who is very active, walks for at least 4 miles a day, does deep water aerobics equaling to 5 miles a day  for 3 times a week, also does stationary bicycle for 30 minutes daily with no symptoms of chest pain or dyspnea, referred to me for evaluation of abnormal EKG.  Past medical history significant for hypertension and hyperlipidemia.  Her physical examination except for mild overweight redness, is completely normal.  Suspect her EKG abnormalities related to female gender, hypertension or it could be a normal variant.  However asymptomatic CAD and myocardial infarction cannot be ruled out hence we will obtain an echocardiogram.  Unless echocardiogram is abnormal, I would not recommend any further cardiac evaluation and we should continue with primary prevention.  She is willing to make changes with her fat intake, lipids are clearly elevated.  She is presently taking red yeast rice 600 mg capsules in the morning, advised her to increase it to 2 capsules and to take it in the evening.  With regard to hypertension, she was hypertensive in office even after checking it twice.  Patient states that her blood pressure has been well controlled at home but she has not checked it in a while.  Would recommend addition of ARB with low-dose diuretic both for diastolic function and better control of blood pressure if in fact her hypertension is not controlled.  Unless echocardiogram is abnormal, I would not recommend further cardiac evaluation and would not recommend stress test.  Other option is to perform coronary calcium score, if coronary calcium score is extremely high, would then certainly recommend statin therapy and discontinue red yeast rice.  Hence will obtain  coronary calcium score.  If the calcium score is low or absent, we could certainly consider continuing present supplements.  It was a great pleasure to meet you today and thank you for the consultation. I will see her back in 4-6 weeks for f/u.     Maria Prows, MD, [facility] 01/31/2021, 9:54 AM Office: [phone]

## 2021-02-01 ENCOUNTER — Telehealth: Payer: Self-pay

## 2021-02-01 NOTE — Telephone Encounter (Signed)
Pt was told to call about bp yesterday. Her bp was 122/74. HR was 79.

## 2021-02-06 ENCOUNTER — Ambulatory Visit: Payer: Medicare Other

## 2021-02-06 ENCOUNTER — Other Ambulatory Visit: Payer: Self-pay

## 2021-02-06 DIAGNOSIS — R9431 Abnormal electrocardiogram [ECG] [EKG]: Secondary | ICD-10-CM

## 2021-02-06 DIAGNOSIS — I10 Essential (primary) hypertension: Secondary | ICD-10-CM

## 2021-02-10 NOTE — Progress Notes (Signed)
Echocardiogram 02/06/2021: Normal LV systolic function with visual EF 60-65%. Left ventricle cavity is normal in size. Normal global wall motion. Normal diastolic filling pattern, normal LAP.  No significant valvular abnormalities. No prior study for comparison  Will discuss on OV. Normal echo

## 2021-02-19 ENCOUNTER — Ambulatory Visit
Admission: RE | Admit: 2021-02-19 | Discharge: 2021-02-19 | Disposition: A | Discharge status: Home / Self Care | Payer: No Typology Code available for payment source | Source: Ambulatory Visit | Attending: Cardiology | Admitting: Cardiology

## 2021-02-19 ENCOUNTER — Telehealth: Payer: Self-pay

## 2021-02-19 DIAGNOSIS — R9431 Abnormal electrocardiogram [ECG] [EKG]: Secondary | ICD-10-CM

## 2021-02-19 DIAGNOSIS — R931 Abnormal findings on diagnostic imaging of heart and coronary circulation: Secondary | ICD-10-CM

## 2021-02-19 DIAGNOSIS — R9389 Abnormal findings on diagnostic imaging of other specified body structures: Secondary | ICD-10-CM

## 2021-02-19 IMAGING — CT CT CARDIAC CORONARY ARTERY CALCIUM SCORE
3 series · 13 of 20 positions shown, 15 images · non-contrast
Comparison: None.

CLINICAL DATA: 69-year-old white female with family history of
heart disease. Elevated cholesterol.

EXAM:
CT CARDIAC CORONARY ARTERY CALCIUM SCORE
TECHNIQUE: Non-contrast imaging through the heart was performed using
prospective ECG gating. Image post processing was performed on an
independent workstation, allowing for quantitative analysis of the
heart and coronary arteries. Note that this exam targets the heart
and the chest was not imaged in its entirety.

[Series 2: calcium scoring 2.00 qr36 bestdiast 69% hrt calciu · axial · 0.37mm/px · z∈[+1796,+1852]mm · 3 of 70 slices shown]
[im 14/70  vessel]
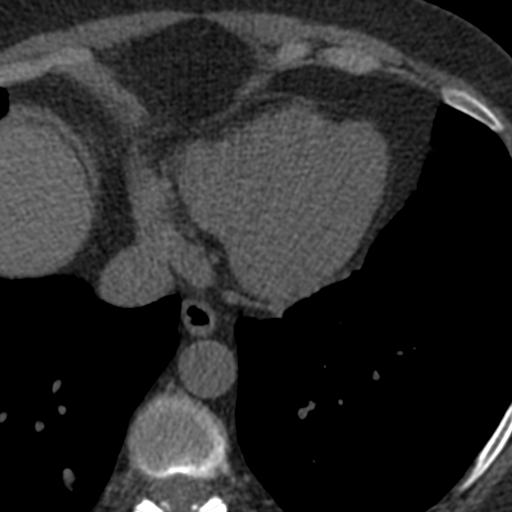
[im 28/70  vessel]
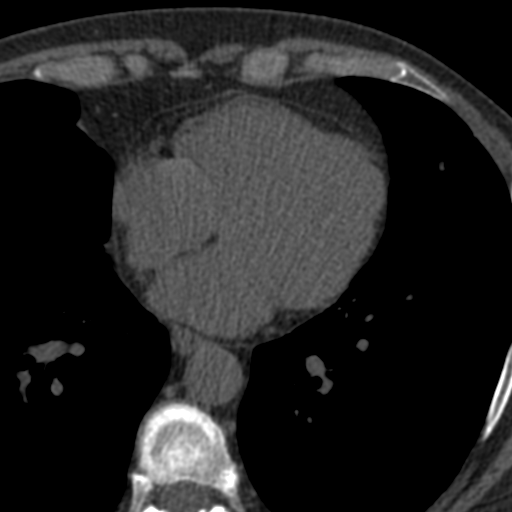
[im 42/70  vessel]
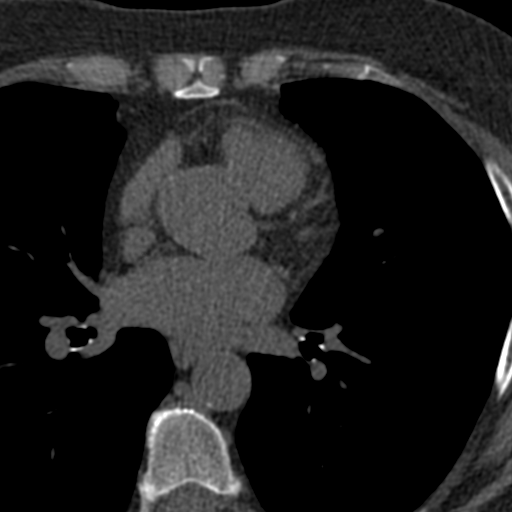

[Series 3: calcium scoring 2.00 br40 bestdiast 69% axial · axial · 0.51mm/px · z∈[+1792,+1884]mm · 5 of 70 slices shown, 7 images]
[im 12/70  vessel]
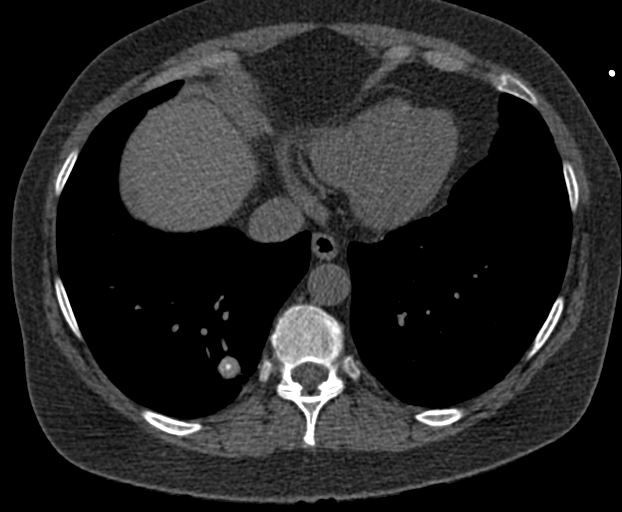
[im 12/70  lung]
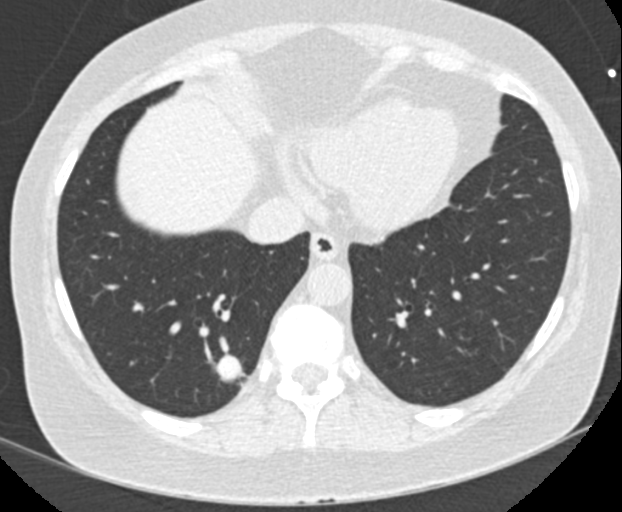
[im 24/70  vessel]
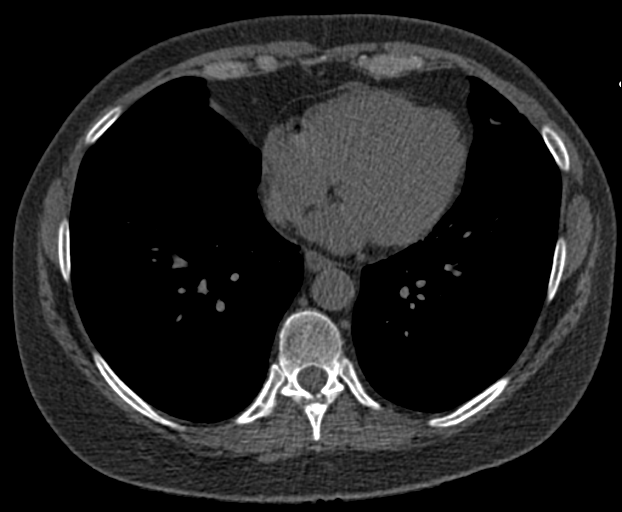
[im 35/70  vessel]
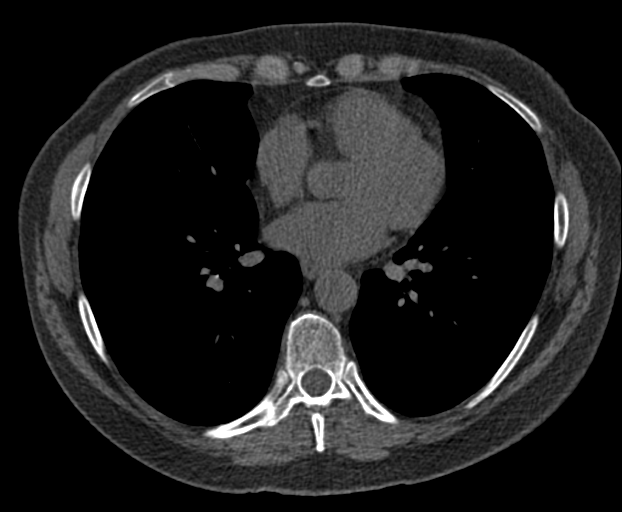
[im 47/70  vessel]
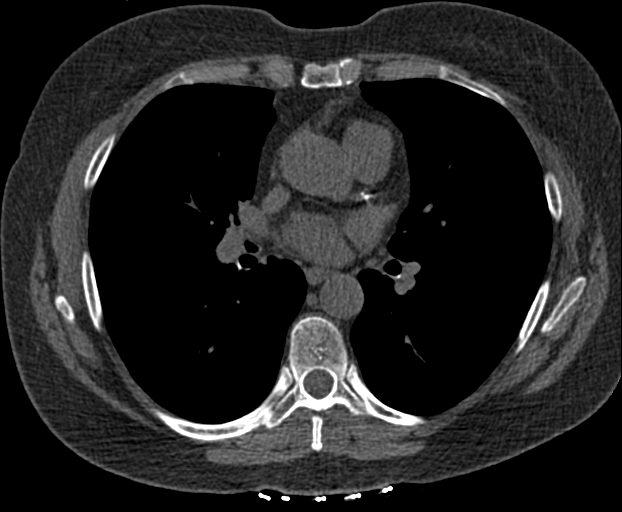
[im 58/70  vessel]
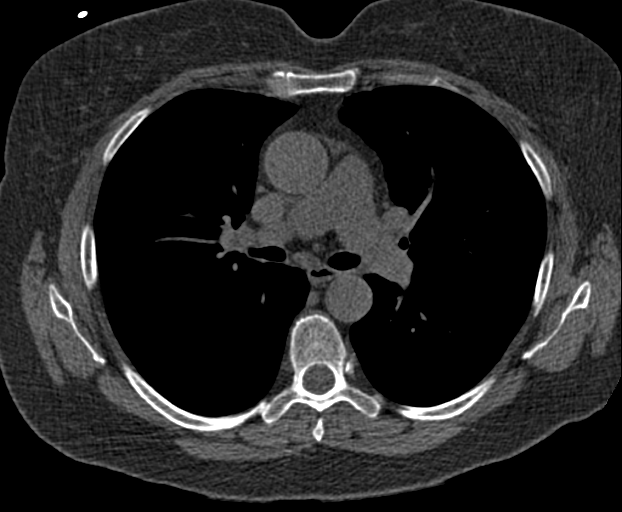
[im 58/70  lung]
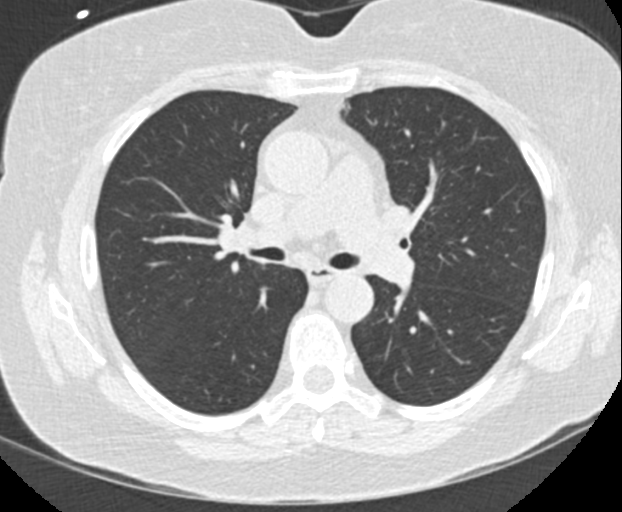

[Series 9: calcium scoring 2.00 br60 bestdiast 69% lungs · axial · 0.53mm/px · z∈[+1792,+1884]mm · 5 of 70 slices shown]
[im 12/70  vessel]
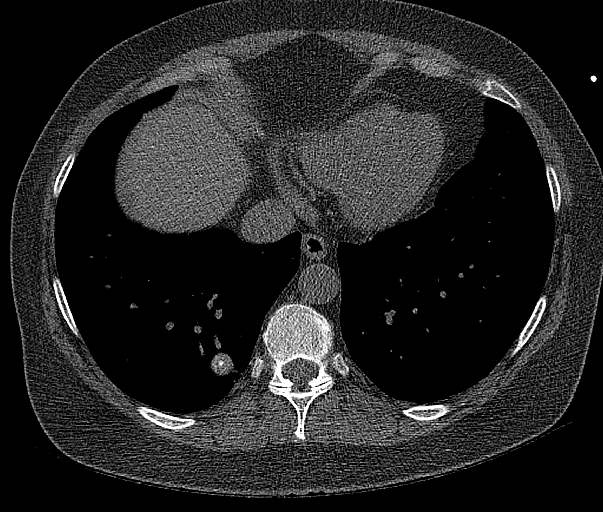
[im 24/70  vessel]
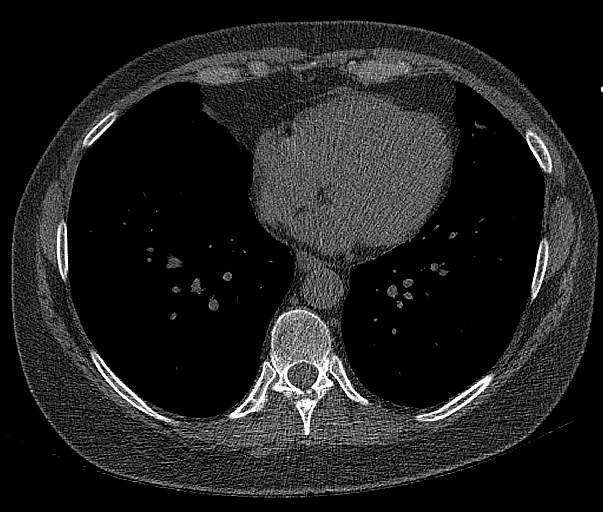
[im 35/70  vessel]
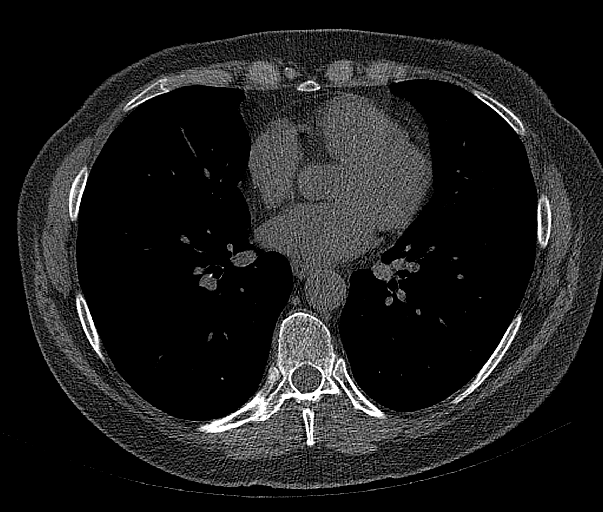
[im 47/70  vessel]
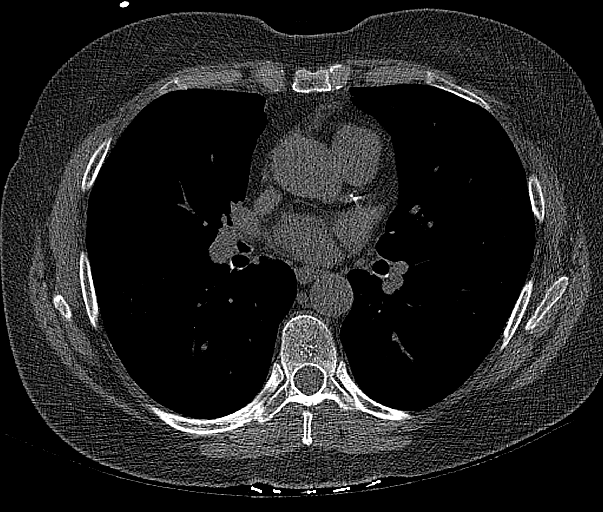
[im 58/70  vessel]
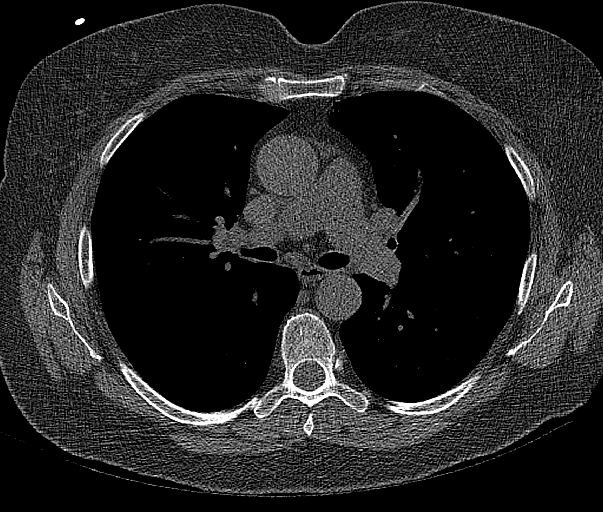

[13 of 20 positions shown; findings below may reference images not displayed]

FINDINGS: CORONARY CALCIUM SCORES:

Left Main: 0

LAD: 119

LCx:

RCA: 0

Total Agatston Score: 122

[HOSPITAL] percentile: 86

AORTA MEASUREMENTS:

Ascending Aorta: 31 mm

Descending Aorta: 25 mm

OTHER FINDINGS:

Coronary calcium score in the left circumflex coronary artery is an
under estimation and coronary calcium score in the LAD is an over
estimation based on difficulty separating these 2 vessels on the
calcium software program. The overall calcium score is accurate.
Scattered calcifications in the mediastinum and right hilum are
compatible with old granulomatous disease. No significant lymph node
enlargement in the visualized mediastinum. Normal appearance of the
visualized upper abdomen. 1.2 cm nodule in the medial right lower
lobe on sequence 9, image 59. This nodule has a central
calcification. Nodular branching material central to this nodule
best seen on the sagittal reformats, sequence 7, image 59. This area
of nodularity is uncertain but could be associated with mucous
plugging. There is a small peripheral branching area in the left
lower lobe on sequence 9, image 19 which is suggestive for mucous
plugging. Several tiny nodules scattered throughout the lungs bases.
Sub solid density in posterior left lower lobe on sequence 9, image
57 measures 4 mm. Index small nodule in the left lower lobe measures
4 mm on sequence 9, image 63. No large pleural effusions. There is
no significant consolidation or airspace disease at the lung bases.
No large pleural effusions. No acute bone abnormality.
IMPRESSION: 1. Coronary calcium score is 122 and this is at percentile 86 for
patients of the same age, gender and ethnicity.
2. Indeterminate 1.2 cm pulmonary nodule in the right lower lobe.
This nodule has a central calcification which suggests that it could
be benign in etiology such as a partially calcified granuloma or
even a hamartoma. However, this nodule is indeterminate and there is
irregular branching material associated with this nodule which could
represent mucous plugging but nonspecific. Additional tiny nodules
scattered throughout the lung bases. Consider one of the following
in 3 months for both low-risk and high-risk individuals: (a) repeat
chest CT, (b) follow-up PET-CT, or (c) tissue sampling. This
recommendation follows the consensus statement: Guidelines for
Management of Incidental Pulmonary Nodules Detected on CT Images:
3. Evidence for old granulomatous disease.

These results will be called to the ordering clinician or
representative by the Radiologist Assistant, and communication
documented in the PACS or [REDACTED].

## 2021-02-20 NOTE — Progress Notes (Signed)
CORONARY CALCIUM SCORES 02/19/2021: Left Main: 0 LAD: 119 LCx: 2.73 RCA: 0 Total Agatston Score: 122. MESA database percentile: 38  AORTA MEASUREMENTS:  Ascending Aorta: 31 mm. Descending Aorta: 25 mm.  Non Cardiac findings: Indeterminate 1.2 cm pulmonary nodule in the right lower lobe. This nodule has a central calcification which suggests that it could be benign in etiology such as a partially calcified granuloma or even a hamartoma. However, this nodule is indeterminate and there is irregular branching material associated with this nodule which could represent mucous plugging but nonspecific. Additional tiny nodules scattered throughout the lung bases. Consider one of the following in 3 months for both low-risk and high-risk individuals: (a) repeat chest CT, (b) follow-up PET-CT, or (c) tissue sampling. 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

## 2021-02-20 NOTE — Telephone Encounter (Signed)
ICD-10-CM   1. Abnormal CT of the chest: 1.2 cm pulmonary nodule right lower lobe and tiny scattered nodules bilateral lung bases. 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 Agatston CAC score 200-399  R93.1    CORONARY CALCIUM SCORES 02/19/2021: Left Main: 0 LAD: 119 LCx: 2.73 RCA: 0 Total Agatston Score: 122. MESA database percentile: 51  AORTA MEASUREMENTS:  Ascending Aorta: 31 mm. Descending Aorta: 25 mm.  Non Cardiac findings: Indeterminate 1.2 cm pulmonary nodule in the right lower lobe. This nodule has a central calcification which suggests that it could be benign in etiology such as a partially calcified granuloma or even a hamartoma. However, this nodule is indeterminate and there is irregular branching material associated with this nodule which could represent mucous plugging but nonspecific. Additional tiny nodules scattered throughout the lung bases. Consider one of the following in 3 months for both low-risk and high-risk individuals: (a) repeat chest CT, (b) follow-up PET-CT, or (c) tissue sampling. 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  CC: Heath Gold, MD (needs f/u abnormal CT findings) CC: Dr. Modesto Charon (CT surgery).   Adrian Prows, MD, [facility] 02/20/2021, 3:55 AM Office: [phone] Fax: [phone] Pager: [phone]

## 2021-02-25 ENCOUNTER — Encounter: Payer: Self-pay | Admitting: Thoracic Surgery (Cardiothoracic Vascular Surgery)

## 2021-02-28 ENCOUNTER — Encounter: Payer: Self-pay | Admitting: Thoracic Surgery (Cardiothoracic Vascular Surgery)

## 2021-02-28 ENCOUNTER — Institutional Professional Consult (permissible substitution) (INDEPENDENT_AMBULATORY_CARE_PROVIDER_SITE_OTHER): Payer: Medicare Other | Admitting: Thoracic Surgery (Cardiothoracic Vascular Surgery)

## 2021-02-28 ENCOUNTER — Other Ambulatory Visit: Payer: Self-pay

## 2021-02-28 VITALS — BP 165/85 | HR 85 | Resp 20 | Ht 66.0 in | Wt 169.0 lb

## 2021-02-28 DIAGNOSIS — R911 Solitary pulmonary nodule: Secondary | ICD-10-CM | POA: Diagnosis not present

## 2021-02-28 NOTE — Progress Notes (Signed)
PCP is Willey Blade, MD Referring Provider is Adrian Prows, MD  No chief complaint on file.   HPI: Maria Love is sent for consultation regarding a lung nodule.  Maria Love is a 70 year old woman with a past history of hypertension, contact dermatitis, seasonal allergies, and migraines.  She smoked less than a pack a day for couple years in college but quit 50 years ago.  She recently had an ECG which was abnormal.  She was referred to Dr. Einar Gip.  He did a coronary CT.  On that she was noted to have a right lower lobe lung nodule.  She denies any prior history of lung nodules.  She grew up in [place], moved to [place] about 5 years ago.  No fevers, chills or night sweats.  No change in appetite or weight loss.  No cough, wheezing, chest pain, or shortness of breath.  Zubrod Score: At the time of surgery this patient's most appropriate activity status/level should be described as: [x]     0    Normal activity, no symptoms []     1    Restricted in physical strenuous activity but ambulatory, able to do out light work []     2    Ambulatory and capable of self care, unable to do work activities, up and about >50 % of waking hours                              []     3    Only limited self care, in bed greater than 50% of waking hours []     4    Completely disabled, no self care, confined to bed or chair []     5    Moribund  Past Medical History:  Diagnosis Date   Hypertension     No past surgical history on file.  Family History  Problem Relation Age of Onset   Arthritis Mother    Hypertension Mother    Hyperlipidemia Mother    Mitral valve prolapse Mother    Pancreatic cancer Father    Arthritis Sister    Thyroid cancer Son    Arthritis Maternal Grandmother    Hypertension Maternal Grandmother    Diabetes Maternal Grandfather    Arthritis Paternal Grandmother    Macular degeneration Paternal Grandmother     Social History Social History   Tobacco Use    Smoking status: Former    Packs/day: 0.25    Years: 1.00    Pack years: 0.25    Types: Cigarettes    Quit date: 01/31/1973    Years since quitting: 48.1   Smokeless tobacco: Never  Vaping Use   Vaping Use: Never used  Substance Use Topics   Alcohol use: No   Drug use: No    Current Outpatient Medications  Medication Sig Dispense Refill   Coenzyme Q10 (COQ10) 100 MG CAPS Take 1 capsule by mouth daily.     metoprolol succinate (TOPROL-XL) 25 MG 24 hr tablet Take 1 tablet (25 mg total) by mouth daily. 90 tablet 1   NON FORMULARY Take 1 capsule by mouth daily. Vitamin K2 D3     Red Yeast Rice 600 MG CAPS Take 2 capsules by mouth daily.     No current facility-administered medications for this visit.    Allergies  Allergen Reactions   Antihistamines, Chlorpheniramine-Type     Review of Systems  Constitutional:  Negative for activity change, chills, diaphoresis,  fever and unexpected weight change.  HENT:  Negative for trouble swallowing and voice change.   Respiratory:  Negative for cough, shortness of breath and wheezing.   Cardiovascular:  Negative for chest pain.  Musculoskeletal:  Positive for arthralgias and joint swelling.  All other systems reviewed and are negative.  Ht 5\' 6"  (1.676 m)   Wt 169 lb (76.7 kg)   LMP  (LMP Unknown)   BMI 27.28 kg/m  Physical Exam Vitals reviewed.  Constitutional:      General: She is not in acute distress.    Appearance: Normal appearance.  HENT:     Head: Normocephalic and atraumatic.  Eyes:     General: No scleral icterus.    Extraocular Movements: Extraocular movements intact.  Cardiovascular:     Rate and Rhythm: Normal rate and regular rhythm.     Heart sounds: Normal heart sounds. No murmur heard.   No friction rub. No gallop.  Pulmonary:     Effort: Pulmonary effort is normal. No respiratory distress.     Breath sounds: Normal breath sounds. No wheezing or rales.  Abdominal:     General: There is no distension.      Palpations: Abdomen is soft.     Tenderness: There is no abdominal tenderness.  Skin:    General: Skin is warm and dry.  Neurological:     General: No focal deficit present.     Mental Status: She is alert and oriented to person, place, and time.     Cranial Nerves: No cranial nerve deficit.     Motor: No weakness.    Diagnostic Tests: CT CARDIAC CORONARY ARTERY CALCIUM SCORE   TECHNIQUE: Non-contrast imaging through the heart was performed using prospective ECG gating. Image post processing was performed on an independent workstation, allowing for quantitative analysis of the heart and coronary arteries. Note that this exam targets the heart and the chest was not imaged in its entirety.   COMPARISON:  None.   FINDINGS: CORONARY CALCIUM SCORES:   Left Main: 0   LAD: 119   LCx: 2.73   RCA: 0   Total Agatston Score: 122   MESA database percentile: 86   AORTA MEASUREMENTS:   Ascending Aorta: 31 mm   Descending Aorta: 25 mm   OTHER FINDINGS:   Coronary calcium score in the left circumflex coronary artery is an under estimation and coronary calcium score in the LAD is an over estimation based on difficulty separating these 2 vessels on the calcium software program. The overall calcium score is accurate. Scattered calcifications in the mediastinum and right hilum are compatible with old granulomatous disease. No significant lymph node enlargement in the visualized mediastinum. Normal appearance of the visualized upper abdomen. 1.2 cm nodule in the medial right lower lobe on sequence 9, image 59. This nodule has a central calcification. Nodular branching material central to this nodule best seen on the sagittal reformats, sequence 7, image 59. This area of nodularity is uncertain but could be associated with mucous plugging. There is a small peripheral branching area in the left lower lobe on sequence 9, image 19 which is suggestive for mucous plugging. Several  tiny nodules scattered throughout the lungs bases. Sub solid density in posterior left lower lobe on sequence 9, image 57 measures 4 mm. Index small nodule in the left lower lobe measures 4 mm on sequence 9, image 63. No large pleural effusions. There is no significant consolidation or airspace disease at the lung bases. No large  pleural effusions. No acute bone abnormality.   IMPRESSION: 1. Coronary calcium score is 122 and this is at percentile 86 for patients of the same age, gender and ethnicity. 2. Indeterminate 1.2 cm pulmonary nodule in the right lower lobe. This nodule has a central calcification which suggests that it could be benign in etiology such as a partially calcified granuloma or even a hamartoma. However, this nodule is indeterminate and there is irregular branching material associated with this nodule which could represent mucous plugging but nonspecific. Additional tiny nodules scattered throughout the lung bases. Consider one of the following in 3 months for both low-risk and high-risk individuals: (a) repeat chest CT, (b) follow-up PET-CT, or (c) tissue sampling. This recommendation follows the consensus statement: Guidelines for Management of Incidental Pulmonary Nodules Detected on CT Images: From the Fleischner Society 2017; Radiology 2017; 284:228-243. 3. 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   These results will be called to the ordering clinician or representative by the Radiologist Assistant, and communication documented in the PACS or [employer].     Electronically Signed   By: Markus Daft M.D.   On: 02/19/2021 15:08 I personally reviewed the CT images.  There is a 1.2 cm right lower lobe nodule.  This is predominantly rounded and has central calcification.  There also are calcified hilar nodes.  Impression: Maria Love is a 70 year old woman with a past history of hypertension, contact dermatitis, seasonal allergies, and migraines.  She  recently had a CT of the chest for coronary calcium score.  She was incidentally noted to have a 1.2 cm right lower lobe nodule.  Lung nodule-1.2 cm with central calcification.  Also calcified hilar nodes.  This is most likely old granulomatous disease.  There were some findings with irregular branching around the nodule.  This does not appear to be true spiculations.  We do not have an old chest x-ray for comparison.  I discussed the options of biopsy or resection versus observation with her.  I favor observation in her case.  She is essentially a lifelong non-smoker.  There is nothing suspicious about the nodule.  The fact that there are calcified nodes would correlate with this being granulomatous disease.  Just to be on the safe side I do think we should continue to observe this radiographically.  Hypertension-blood pressure elevated at today's visit.  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  Plan: Return in 3 months with CT chest  Melrose Nakayama, MD [facility] [phone]

## 2021-03-07 ENCOUNTER — Encounter: Payer: Self-pay | Admitting: Thoracic Surgery (Cardiothoracic Vascular Surgery)

## 2021-03-14 ENCOUNTER — Other Ambulatory Visit: Payer: Self-pay

## 2021-03-14 ENCOUNTER — Encounter: Payer: Self-pay | Admitting: Cardiology

## 2021-03-14 ENCOUNTER — Ambulatory Visit: Payer: Medicare Other | Admitting: Cardiology

## 2021-03-14 VITALS — BP 157/88 | HR 76 | Temp 98.1°F | Resp 17 | Ht 66.0 in | Wt 173.0 lb

## 2021-03-14 DIAGNOSIS — R0600 Dyspnea, unspecified: Secondary | ICD-10-CM

## 2021-03-14 DIAGNOSIS — R931 Abnormal findings on diagnostic imaging of heart and coronary circulation: Secondary | ICD-10-CM

## 2021-03-14 DIAGNOSIS — R0609 Other forms of dyspnea: Secondary | ICD-10-CM

## 2021-03-14 DIAGNOSIS — I10 Essential (primary) hypertension: Secondary | ICD-10-CM

## 2021-03-14 DIAGNOSIS — E7800 Pure hypercholesterolemia, unspecified: Secondary | ICD-10-CM

## 2021-03-14 MED ORDER — ATORVASTATIN CALCIUM 40 MG PO TABS: 40.0000 mg | ORAL_TABLET | Freq: Every day | ORAL | 2 refills | Status: DC

## 2021-03-14 NOTE — Progress Notes (Signed)
**Note Maria-Identified via Obfuscation** Primary Physician/Referring:  Andi Devon, MD  Patient ID: Maria Love, female    DOB: [birth year], 70 y.o.   MRN: [id number]  Chief Complaint  Patient presents with   Follow-up    6 WEEKS   Abnormal ECG   HPI:    Maria Love  is a 70 y.o.  Caucasian female who is fairly active, walks for at least 4 miles a day, does deep water aerobics equaling to 5 miles a day for 3 times a week, also does stationary bicycle for 30 minutes daily with no symptoms of chest pain or dyspnea, referred to me for evaluation of abnormal EKG.  Past medical history significant for hypertension and hyperlipidemia.  I had last seen her 6 weeks ago and she had not mentioned dyspnea on exertion.  She underwent echocardiogram and also coronary calcium score testing and presents for follow-up.  She has been monitoring her blood pressure closely at home since last office visit as her blood pressure was elevated.  Except for dyspnea on exertion no chest pain or palpitations.  Past Medical History:  Diagnosis Date   Hypertension    History reviewed. No pertinent surgical history. Family History  Problem Relation Age of Onset   Arthritis Mother    Hypertension Mother    Hyperlipidemia Mother    Mitral valve prolapse Mother    Pancreatic cancer Father    Arthritis Sister    Thyroid cancer Son    Arthritis Maternal Grandmother    Hypertension Maternal Grandmother    Diabetes Maternal Grandfather    Arthritis Paternal Grandmother    Macular degeneration Paternal Grandmother     Social History   Tobacco Use   Smoking status: Former    Packs/day: 0.25    Years: 1.00    Pack years: 0.25    Types: Cigarettes    Quit date: 01/31/1973    Years since quitting: 48.1   Smokeless tobacco: Never  Substance Use Topics   Alcohol use: No   Marital Status: Single  ROS  Review of Systems  Cardiovascular:  Positive for dyspnea on exertion. Negative for chest pain and leg swelling.   Gastrointestinal:  Negative for melena.  Objective  Blood pressure (!) 157/88, pulse 76, temperature 98.1 F (36.7 C), temperature source Temporal, resp. rate 17, height 5\' 6"  (1.676 m), weight 173 lb (78.5 kg), SpO2 99 %. Body mass index is 27.92 kg/m.  Vitals with BMI 03/14/2021 03/14/2021 02/28/2021  Height - 5\' 6"  5\' 6"   Weight - 173 lbs 169 lbs  BMI - 27.94 27.29  Systolic 157 155 04/30/2021  Diastolic 88 85 85  Pulse 76 82 85     Physical Exam  Constitutional: No distress.  Eyes: Conjunctivae are normal.  Neck: No JVD present.  Cardiovascular: Regular rhythm, normal heart sounds, intact distal pulses and normal pulses. Exam reveals no gallop.  No murmur heard. Pulmonary/Chest: Effort normal and breath sounds normal. She exhibits no tenderness.  Abdominal: Soft. Bowel sounds are normal.  Musculoskeletal:        General: No edema. Normal range of motion.     Cervical back: Neck supple.  Neurological: She is alert and oriented to person, place, and time.  Skin: Skin is warm.    Laboratory examination:   No results for input(s): NA, K, CL, CO2, GLUCOSE, BUN, CREATININE, CALCIUM, GFRNONAA, GFRAA in the last 8760 hours. CrCl cannot be calculated (Patient's most recent lab result is older than the maximum 21 days allowed.).  CMP Latest Ref Rng & Units 07/28/2017 08/14/2016  Glucose 70 - 99 mg/dL 90 -  BUN 6 - 23 mg/dL 14 -  Creatinine 0.40 - 1.20 mg/dL 0.74 0.7  Sodium 135 - 145 mEq/L 139 145  Potassium 3.5 - 5.1 mEq/L 4.5 4.5  Chloride 96 - 112 mEq/L 104 -  CO2 19 - 32 mEq/L 28 -  Calcium 8.4 - 10.5 mg/dL 10.2 -  Total Protein 6.0 - 8.3 g/dL 7.2 -  Total Bilirubin 0.2 - 1.2 mg/dL 0.4 -  Alkaline Phos 39 - 117 U/L 101 -  AST 0 - 37 U/L 16 16  ALT 0 - 35 U/L 15 14   CBC Latest Ref Rng & Units 07/28/2017  WBC 4.0 - 10.5 K/uL 7.0  Hemoglobin 12.0 - 15.0 g/dL 13.7  Hematocrit 36.0 - 46.0 % 42.2  Platelets 150.0 - 400.0 K/uL 258.0    Lipid Panel No results for input(s): CHOL,  TRIG, LDLCALC, VLDL, HDL, CHOLHDL, LDLDIRECT in the last 8760 hours. Lipid Panel     Component Value Date/Time   CHOL 252 (H) 07/28/2017 1357   TRIG 145.0 07/28/2017 1357   HDL 45.50 07/28/2017 1357   CHOLHDL 6 07/28/2017 1357   VLDL 29.0 07/28/2017 1357   LDLCALC 177 (H) 07/28/2017 1357    External labs:   Labs 12/03/2020:  LDL particle number markedly elevated at 1591 (improved from 1986 on 10/07/2018).  Total cholesterol 217, triglycerides 164, HDL 46, LDL 141.  LP-IR score 69, elevated.  Apo B/A- ratio 0.9, mildly elevated.  LP-PLA-2 is normal at 153.  CRP 1.47.  Serum glucose $RemoveBefor'1 1 1 'iItXmPUuMMcU$ mg, BUN 12, creatinine 0.86, EGFR 74 mL, potassium 4.3, CMP otherwise normal.  Hb 15.1/HCT 43.9, platelets 272.  A1c 5.7%.  TSH normal at 1.220.  Vitamin D66.7.  Medications and allergies   Allergies  Allergen Reactions   Antihistamines, Chlorpheniramine-Type      Current Outpatient Medications on File Prior to Visit  Medication Sig Dispense Refill   Coenzyme Q10 (COQ10) 100 MG CAPS Take 1 capsule by mouth daily.     metoprolol succinate (TOPROL-XL) 25 MG 24 hr tablet Take 1 tablet (25 mg total) by mouth daily. 90 tablet 1   NON FORMULARY Take 1 capsule by mouth daily. Vitamin K2 D3     No current facility-administered medications on file prior to visit.  Medications after present encounter: Current Outpatient Medications  Medication Instructions   atorvastatin (LIPITOR) 40 mg, Oral, Daily   Coenzyme Q10 (COQ10) 100 MG CAPS 1 capsule, Oral, Daily   metoprolol succinate (TOPROL-XL) 25 mg, Oral, Daily   NON FORMULARY 1 capsule, Oral, Daily, Vitamin K2 D3      Radiology:   No results found.  Cardiac Studies:   Outside records: ABI 12/03/2020: Normal at 1.03 right, 1.27 left.  CORONARY CALCIUM SCORE 02/19/2021: Left Main: 0 LAD: 119 LCx: 2.73 RCA: 0 Total Agatston Score: 122. MESA database percentile: 6   AORTA MEASUREMENTS:  Ascending Aorta: 31 mm. Descending Aorta: 25  mm.  Non Cardiac findings: Indeterminate 1.2 cm pulmonary nodule in the right lower lobe. This nodule has a central calcification which suggests that it could be benign in etiology such as a partially calcified granuloma or even a hamartoma. However, this nodule is indeterminate and there is irregular branching material associated with this nodule which could represent mucous plugging but nonspecific. Additional tiny nodules scattered throughout the lung bases. Consider one of the following in 3 months for both low-risk and  high-risk individuals: (a) repeat chest CT, (b) follow-up PET-CT, or (c) tissue sampling. 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  Echocardiogram 02/06/2021: Normal LV systolic function with visual EF 60-65%. Left ventricle cavity is normal in size. Normal global wall motion. Normal diastolic filling pattern,normal LAP.  No significant valvular abnormalities. No prior study for comparison  EKG:     EKG 01/31/2021: Normal sinus rhythm at rate of 99 bpm, normal axis, anteroseptal infarct old.  No evidence of ischemia.   No significant change from 12/03/2020.  Assessment     ICD-10-CM   1. Agatston CAC score 200-399  R93.1 PCV MYOCARDIAL PERFUSION WO LEXISCAN    Lipoprotein A (LPA)    2. Dyspnea on exertion  R06.00 PCV MYOCARDIAL PERFUSION WO LEXISCAN    3. Hypercholesteremia  E78.00 atorvastatin (LIPITOR) 40 MG tablet    Lipid Panel With LDL/HDL Ratio    Lipoprotein A (LPA)    4. Primary hypertension  I10     5. White coat syndrome with diagnosis of hypertension  I10       Patient's cardiovascular risk history includes: LDL goal is under 100 Framingham 10-year risk 10-20%   Medications Discontinued During This Encounter  Medication Reason   Red Yeast Rice 600 MG CAPS Change in therapy     Meds ordered this encounter  Medications   atorvastatin (LIPITOR) 40 MG tablet    Sig: Take 1 tablet (40 mg total) by mouth daily.    Dispense:  30 tablet    Refill:   2    Orders Placed This Encounter  Procedures   Lipid Panel With LDL/HDL Ratio   Lipoprotein A (LPA)   PCV MYOCARDIAL PERFUSION WO LEXISCAN    Standing Status:   Future    Standing Expiration Date:   05/14/2021    Recommendations:   Delorise Hunkele is a 70 y.o. Caucasian female who is fairly active, walks for at least 4 miles a day, does deep water aerobics equaling to 5 miles a day for 3 times a week, also does stationary bicycle for 30 minutes daily with no symptoms of chest pain or dyspnea, referred to me for evaluation of abnormal EKG.  Past medical history significant for hypertension and hyperlipidemia.  I had last seen her 6 weeks ago and she had not mentioned dyspnea on exertion.  I reviewed the results of the coronary CT calcium score which is in the 90th percentile.  In view of her risk factors and CV risk of 10 to 20%, I have recommended stress testing to exclude significant coronary artery disease as etiology for dyspnea.  I reviewed her lipids again with the patient, she is now willing to take a statin.  Previously was reluctant to taking statins, had been taking red yeast rice capsules.  I have started her on Lipitor 40 mg daily.  Will obtain lipids in couple months.  Her blood pressure is elevated, however brings home recordings which are under excellent control.  We will consider addition of ACE inhibitor's on her next office visit as patient is reluctant with medication changes and also would like to evaluate for myocardial ischemia.  She is currently on low-dose beta-blocker therapy.  I would advise her aspirin 81 mg daily in view of elevated CV risk.  We will do this on her next office visit.    Adrian Prows, MD, [facility] 03/16/2021, 11:09 AM Office: [phone]

## 2021-03-14 NOTE — Patient Instructions (Signed)
Hold Metoprolol 1 day prior to test and day of stress test

## 2021-04-12 LAB — LIPID PANEL WITH LDL/HDL RATIO
Cholesterol, Total: 166 mg/dL (ref 100–199)
HDL: 49 mg/dL (ref >39)
LDL Chol Calc (NIH): 96 mg/dL (ref 0–99)
LDL/HDL Ratio: 2 ratio (ref 0.0–3.2)
Triglycerides: 115 mg/dL (ref 0–149)
VLDL Cholesterol Cal: 21 mg/dL (ref 5–40)

## 2021-04-12 LAB — LIPOPROTEIN A (LPA): Lipoprotein (a): 64.9 nmol/L (ref <75.0)

## 2021-04-15 ENCOUNTER — Ambulatory Visit: Payer: Medicare Other

## 2021-04-15 ENCOUNTER — Other Ambulatory Visit: Payer: Self-pay

## 2021-04-15 DIAGNOSIS — R931 Abnormal findings on diagnostic imaging of heart and coronary circulation: Secondary | ICD-10-CM

## 2021-04-15 DIAGNOSIS — R0609 Other forms of dyspnea: Secondary | ICD-10-CM

## 2021-04-15 DIAGNOSIS — R0600 Dyspnea, unspecified: Secondary | ICD-10-CM

## 2021-04-17 NOTE — Progress Notes (Signed)
She is seeing you next month.  I have sent MyChart messaging for her.

## 2021-04-23 ENCOUNTER — Other Ambulatory Visit: Payer: Self-pay | Admitting: *Deleted

## 2021-04-23 DIAGNOSIS — R918 Other nonspecific abnormal finding of lung field: Secondary | ICD-10-CM

## 2021-05-03 ENCOUNTER — Other Ambulatory Visit: Payer: Self-pay

## 2021-05-03 DIAGNOSIS — E7800 Pure hypercholesterolemia, unspecified: Secondary | ICD-10-CM

## 2021-05-03 MED ORDER — ATORVASTATIN CALCIUM 40 MG PO TABS: 40.0000 mg | ORAL_TABLET | Freq: Every day | ORAL | 2 refills | Status: DC

## 2021-05-11 NOTE — Progress Notes (Signed)
Primary Physician/Referring:  Willey Blade, MD  Patient ID: Maria Love, female    DOB: 09/16/1951, 70 y.o.   MRN: [id number]  Chief Complaint  Patient presents with   Follow-up    2 months   Hypertension   Shortness of Breath   Hyperlipidemia   HPI:    Maria Love  is a 70 y.o.  Caucasian female who is fairly active, walks for at least 4 miles a day, does deep water aerobics equaling to 5 miles a day for 3 times a week, also does stationary bicycle for 30 minutes daily with no symptoms of chest pain or dyspnea, originally referred to our office for evaluation of abnormal EKG.  History significant for hypertension and hyperlipidemia.  She subsequently underwent coronary calcium score, which is in the 90th percentile as well as echocardiogram revealing normal LVEF.  Patient presents for 6-week follow-up.  Last office visit started patient on Lipitor 40 mg daily, recommended initiation of ACE inhibitor/ARB as well as aspirin 81 mg daily, however patient was hesitant to make medications at that time.  Also obtained nuclear stress test, Which is low risk.  Patient is tolerating Lipitor without issue.  She notes that during her stress test she was admitted by right hip bursitis.  She continues to exercise regularly including deep water aerobics and walking without issue.  Denies chest pain, palpitations, orthopnea, PND.  She does have dyspnea on exertion, but remains active.  She states home blood pressure readings average 789-381 mmHg systolic.  Patient reports history of cough with ACE inhibitor's.  Past Medical History:  Diagnosis Date   Hypertension    Past Surgical History:  Procedure Laterality Date   FOOT SURGERY Left 2012   Family History  Problem Relation Age of Onset   Arthritis Mother    Hypertension Mother    Hyperlipidemia Mother    Mitral valve prolapse Mother    Pancreatic cancer Father    Arthritis Sister    Hypertension Sister     Arthritis Maternal Grandmother    Hypertension Maternal Grandmother    Diabetes Maternal Grandfather    Arthritis Paternal Grandmother    Macular degeneration Paternal Grandmother    Thyroid cancer Son     Social History   Tobacco Use   Smoking status: Former    Packs/day: 0.25    Years: 1.00    Pack years: 0.25    Types: Cigarettes    Quit date: 01/31/1973    Years since quitting: 48.3   Smokeless tobacco: Never  Substance Use Topics   Alcohol use: No   Marital Status: Single  ROS  Review of Systems  Constitutional: Negative for malaise/fatigue and weight gain.  Cardiovascular:  Positive for dyspnea on exertion (stable). Negative for chest pain, claudication, leg swelling, near-syncope, orthopnea, palpitations, paroxysmal nocturnal dyspnea and syncope.  Respiratory:  Negative for shortness of breath.   Gastrointestinal:  Negative for melena.  Objective  Blood pressure 134/71, pulse 80, temperature 97.6 F (36.4 C), temperature source Temporal, resp. rate 17, height $RemoveBe'5\' 6"'YGIPtlleS$  (1.676 m), weight 172 lb 12.8 oz (78.4 kg), SpO2 95 %. Body mass index is 27.89 kg/m.  Vitals with BMI 05/14/2021 05/14/2021 03/14/2021  Height - $Remove'5\' 6"'VBfslGS$  -  Weight - 172 lbs 13 oz -  BMI - 01.7 -  Systolic 510 258 527  Diastolic 71 76 88  Pulse 80 87 76    Physical Exam Vitals reviewed.  Constitutional:      General: She is  not in acute distress. Neck:     Vascular: No JVD.  Cardiovascular:     Rate and Rhythm: Normal rate and regular rhythm.     Pulses: Normal pulses and intact distal pulses.     Heart sounds: Normal heart sounds, S1 normal and S2 normal. No murmur heard.   No gallop.  Pulmonary:     Effort: Pulmonary effort is normal. No respiratory distress.     Breath sounds: Normal breath sounds. No wheezing, rhonchi or rales.  Chest:     Chest wall: No tenderness.  Abdominal:     General: Bowel sounds are normal.     Palpations: Abdomen is soft.  Musculoskeletal:        General: Normal  Love of motion.     Cervical back: Neck supple.     Right lower leg: No edema.     Left lower leg: No edema.  Neurological:     Mental Status: She is alert and oriented to person, place, and time.   Laboratory examination:   No results for input(s): NA, K, CL, CO2, GLUCOSE, BUN, CREATININE, CALCIUM, GFRNONAA, GFRAA in the last 8760 hours. CrCl cannot be calculated (Patient's most recent lab result is older than the maximum 21 days allowed.).  CMP Latest Ref Rng & Units 07/28/2017 08/14/2016  Glucose 70 - 99 mg/dL 90 -  BUN 6 - 23 mg/dL 14 -  Creatinine 0.40 - 1.20 mg/dL 0.74 0.7  Sodium 135 - 145 mEq/L 139 145  Potassium 3.5 - 5.1 mEq/L 4.5 4.5  Chloride 96 - 112 mEq/L 104 -  CO2 19 - 32 mEq/L 28 -  Calcium 8.4 - 10.5 mg/dL 10.2 -  Total Protein 6.0 - 8.3 g/dL 7.2 -  Total Bilirubin 0.2 - 1.2 mg/dL 0.4 -  Alkaline Phos 39 - 117 U/L 101 -  AST 0 - 37 U/L 16 16  ALT 0 - 35 U/L 15 14   CBC Latest Ref Rng & Units 07/28/2017  WBC 4.0 - 10.5 K/uL 7.0  Hemoglobin 12.0 - 15.0 g/dL 13.7  Hematocrit 36.0 - 46.0 % 42.2  Platelets 150.0 - 400.0 K/uL 258.0    Lipid Panel Recent Labs    04/11/21 0822  CHOL 166  TRIG 115  LDLCALC 96  HDL 49   Lipid Panel     Component Value Date/Time   CHOL 166 04/11/2021 0822   TRIG 115 04/11/2021 0822   HDL 49 04/11/2021 0822   CHOLHDL 6 07/28/2017 1357   VLDL 29.0 07/28/2017 1357   LDLCALC 96 04/11/2021 0822   LABVLDL 21 04/11/2021 0822  04/11/2021: Lipoprotein a 64.9  External labs:   Labs 12/03/2020:  LDL particle number markedly elevated at 1591 (improved from 1986 on 10/07/2018).  Total cholesterol 217, triglycerides 164, HDL 46, LDL 141.  LP-IR score 69, elevated.  Apo B/A- ratio 0.9, mildly elevated.  LP-PLA-2 is normal at 153.  CRP 1.47.  Serum glucose $RemoveBefor'1 1 1 'JsaFOuhjWcUw$ mg, BUN 12, creatinine 0.86, EGFR 74 mL, potassium 4.3, CMP otherwise normal.  Hb 15.1/HCT 43.9, platelets 272.  A1c 5.7%.  TSH normal at 1.220.  Vitamin D66.7.  Allergies    Allergies  Allergen Reactions   Antihistamines, Chlorpheniramine-Type     Medications Prior to Visit:   Outpatient Medications Prior to Visit  Medication Sig Dispense Refill   atorvastatin (LIPITOR) 40 MG tablet Take 1 tablet (40 mg total) by mouth daily. 30 tablet 2   Coenzyme Q10 (COQ10) 100 MG CAPS Take 1  capsule by mouth daily.     metoprolol succinate (TOPROL-XL) 25 MG 24 hr tablet Take 1 tablet (25 mg total) by mouth daily. 90 tablet 1   NON FORMULARY Take 1 capsule by mouth daily. Vitamin K2 D3     No facility-administered medications prior to visit.   Final Medications at End of Visit    Current Meds  Medication Sig   aspirin EC 81 MG tablet Take 1 tablet (81 mg total) by mouth daily. Swallow whole.   atorvastatin (LIPITOR) 40 MG tablet Take 1 tablet (40 mg total) by mouth daily.   Coenzyme Q10 (COQ10) 100 MG CAPS Take 1 capsule by mouth daily.   metoprolol succinate (TOPROL-XL) 25 MG 24 hr tablet Take 1 tablet (25 mg total) by mouth daily.   NON FORMULARY Take 1 capsule by mouth daily. Vitamin K2 D3    Radiology:   No results found.  Cardiac Studies:   Outside records: ABI 12/03/2020: Normal at 1.03 right, 1.27 left.  CORONARY CALCIUM SCORE 02/19/2021: Left Main: 0 LAD: 119 LCx: 2.73 RCA: 0 Total Agatston Score: 122. MESA database percentile: 25   AORTA MEASUREMENTS:  Ascending Aorta: 31 mm. Descending Aorta: 25 mm.  Non Cardiac findings: Indeterminate 1.2 cm pulmonary nodule in the right lower lobe. This nodule has a central calcification which suggests that it could be benign in etiology such as a partially calcified granuloma or even a hamartoma. However, this nodule is indeterminate and there is irregular branching material associated with this nodule which could represent mucous plugging but nonspecific. Additional tiny nodules scattered throughout the lung bases. Consider one of the following in 3 months for both low-risk and high-risk individuals: (a)  repeat chest CT, (b) follow-up PET-CT, or (c) tissue sampling. 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  Echocardiogram 02/06/2021: Normal LV systolic function with visual EF 60-65%. Left ventricle cavity is normal in size. Normal global wall motion. Normal diastolic filling pattern,normal LAP.  No significant valvular abnormalities. No prior study for comparison  PCV MYOCARDIAL PERFUSION WO LEXISCAN 04/15/2021 Normal ECG stress. The patient exercised for 3 minutes and 30 seconds of a Bruce protocol, achieving approximately 5.23 METs. Exercise capacity is low.  Accelerated heart rate response.  Normal blood pressure response.  Test terminated due to fatigue. Myocardial perfusion is normal. Overall LV systolic function is normal without regional wall motion abnormalities. Stress LV EF: 70%. No previous exam available for comparison. Low risk.  EKG:   01/31/2021: Normal sinus rhythm at rate of 99 bpm, normal axis, anteroseptal infarct old.  No evidence of ischemia.   No significant change from 12/03/2020.  Assessment     ICD-10-CM   1. Agatston CAC score 200-399  R93.1     2. Hypercholesteremia  E78.00     3. Primary hypertension  I10       Patient's cardiovascular risk history includes: LDL goal is under 100 Fr[place]ham 10-year risk 10-20%   There are no discontinued medications.    Meds ordered this encounter  Medications   aspirin EC 81 MG tablet    Sig: Take 1 tablet (81 mg total) by mouth daily. Swallow whole.    Dispense:  90 tablet    Refill:  3    No orders of the defined types were placed in this encounter.   Recommendations:   Maria Love is a 70 y.o. Caucasian female who is fairly active, walks for at least 4 miles a day, does deep water aerobics equaling to 5 miles a  day for 3 times a week, also does stationary bicycle for 30 minutes daily with no symptoms of chest pain or dyspnea, originally referred to our office for evaluation of abnormal EKG.   History significant for hypertension and hyperlipidemia.  She subsequently underwent coronary calcium score, which is in the 90th percentile as well as echocardiogram revealing normal LVEF.  Patient presents for 6-week follow-up.  Last office visit started patient on Lipitor 40 mg daily, recommended initiation of ACE inhibitor/ARB as well as aspirin 81 mg daily, however patient was hesitant to make medications at that time.  Also obtained nuclear stress test, Which is low risk.  Patient is doing well overall without specific complaints today.  Reviewed and discussed results of coronary calcium score as well as nuclear stress test, details above.  Coronary calcium score in the 86 percentile, however stress test low risk.  Notably patient's right hip bursitis likely limited her exercise during the stress test.  EKG without evidence of ischemia and normal myocardial perfusion, do not suspect coronary artery disease as etiology for dyspnea.  Will continue Lipitor and metoprolol.  We will add aspirin 81 mg daily.  Also discussed with patient recommendation of ARB therapy given elevated CV risk and coronary calcium score.  Patient is resistant to starting ARB therapy at this time despite acknowledgment of long-term benefits and risk reduction.  We will readdress at her next visit.  Patient has annual physical scheduled with PCP in about 6 months, she is stable from a cardiovascular standpoint.  We will plan to follow-up with patient in 7 to 8 months following annual PCP visit.  Have requested that patient have labs sent to our office for review.   Alethia Berthold, PA-C 05/14/2021, 9:19 AM Office: [phone]

## 2021-05-14 ENCOUNTER — Encounter: Payer: Self-pay | Admitting: Student

## 2021-05-14 ENCOUNTER — Other Ambulatory Visit: Payer: Self-pay

## 2021-05-14 ENCOUNTER — Ambulatory Visit: Payer: Medicare Other | Admitting: Student

## 2021-05-14 VITALS — BP 134/71 | HR 80 | Temp 97.6°F | Resp 17 | Ht 66.0 in | Wt 172.8 lb

## 2021-05-14 DIAGNOSIS — E7800 Pure hypercholesterolemia, unspecified: Secondary | ICD-10-CM

## 2021-05-14 DIAGNOSIS — R931 Abnormal findings on diagnostic imaging of heart and coronary circulation: Secondary | ICD-10-CM

## 2021-05-14 DIAGNOSIS — I10 Essential (primary) hypertension: Secondary | ICD-10-CM

## 2021-05-14 MED ORDER — ASPIRIN EC 81 MG PO TBEC: 81.0000 mg | DELAYED_RELEASE_TABLET | Freq: Every day | ORAL | 3 refills | Status: AC

## 2021-06-07 ENCOUNTER — Ambulatory Visit
Admission: RE | Admit: 2021-06-07 | Discharge: 2021-06-07 | Disposition: A | Discharge status: Home / Self Care | Payer: Medicare Other | Source: Ambulatory Visit | Attending: Thoracic Surgery (Cardiothoracic Vascular Surgery) | Admitting: Thoracic Surgery (Cardiothoracic Vascular Surgery)

## 2021-06-07 ENCOUNTER — Other Ambulatory Visit: Payer: Self-pay | Admitting: Thoracic Surgery (Cardiothoracic Vascular Surgery)

## 2021-06-07 ENCOUNTER — Other Ambulatory Visit: Payer: Self-pay | Admitting: Internal Medicine

## 2021-06-07 DIAGNOSIS — R918 Other nonspecific abnormal finding of lung field: Secondary | ICD-10-CM

## 2021-06-07 DIAGNOSIS — Z1231 Encounter for screening mammogram for malignant neoplasm of breast: Secondary | ICD-10-CM

## 2021-06-07 DIAGNOSIS — E2839 Other primary ovarian failure: Secondary | ICD-10-CM

## 2021-06-07 IMAGING — CT CT CHEST W/O CM
2 of 4 series · 12 of 36 positions shown, 15 images · non-contrast
Comparison: Cardiac CT [DATE]

CLINICAL DATA: Pulmonary nodules.

EXAM:
CT CHEST WITHOUT CONTRAST
TECHNIQUE: Multidetector CT imaging of the chest was performed following the
standard protocol without IV contrast.

[Series 2: chest 2.00 br40 s3 · axial · 0.57mm/px · z∈[+1413,+1684]mm · 9 of 162 slices shown, 12 images (1 of 2)]
[im 13/162  mediastinal]
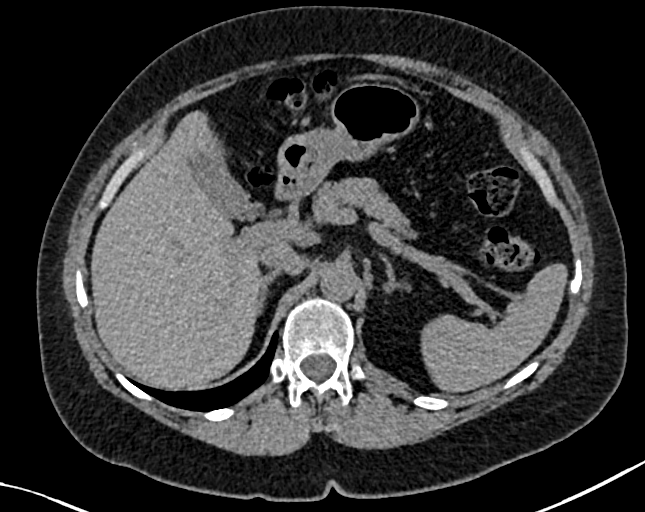
[im 13/162  lung]
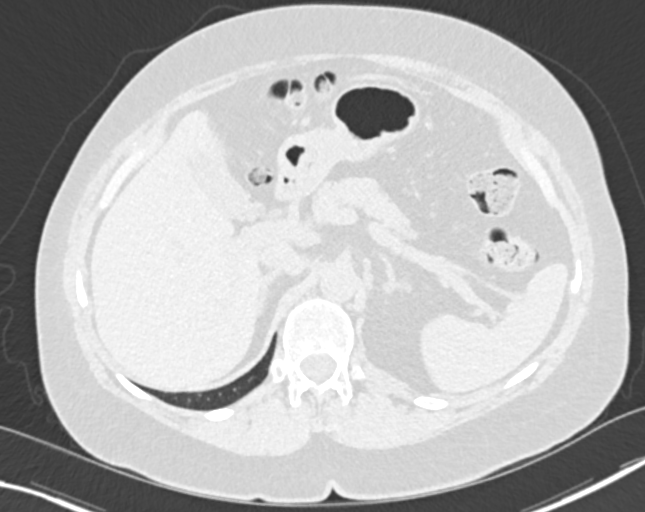
[im 38/162  lung]
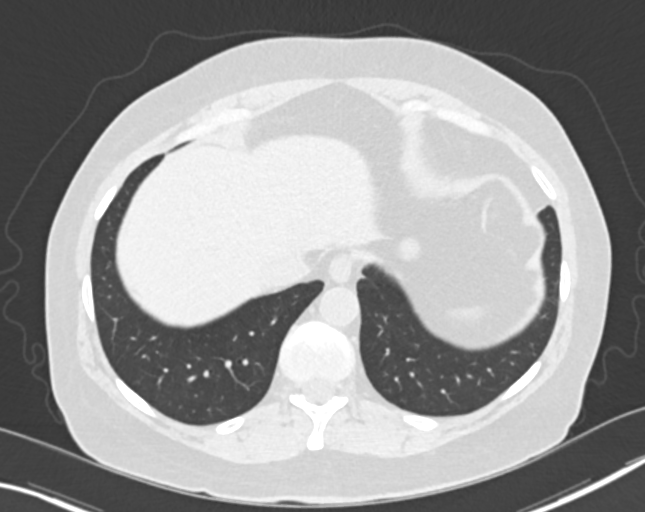
[im 50/162  lung]
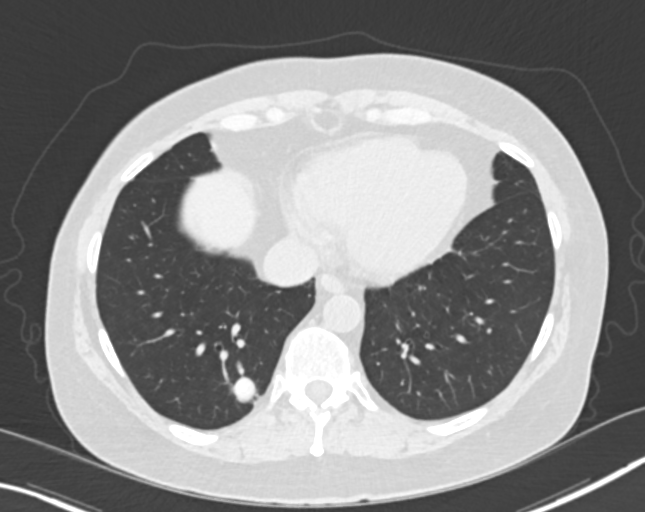
[im 62/162  lung]
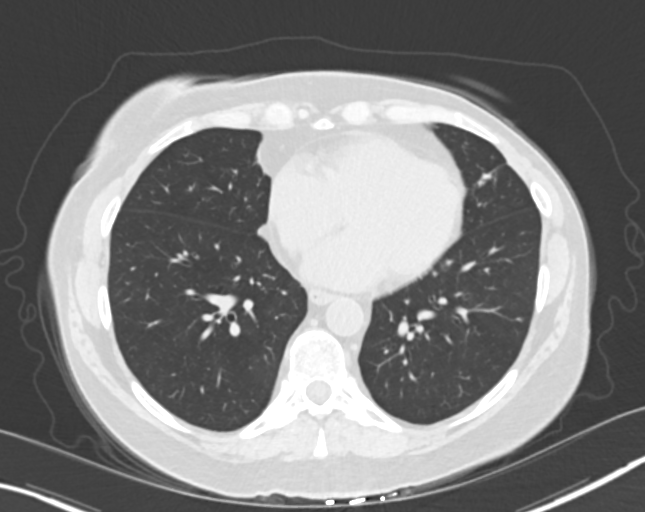
[im 87/162  mediastinal]
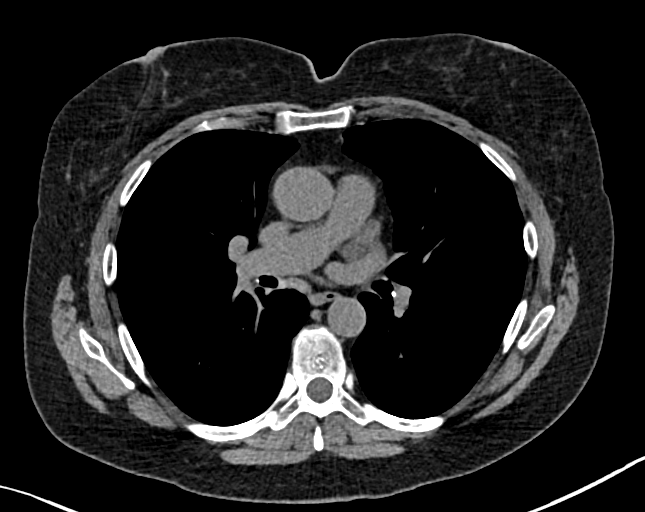
[im 87/162  lung]
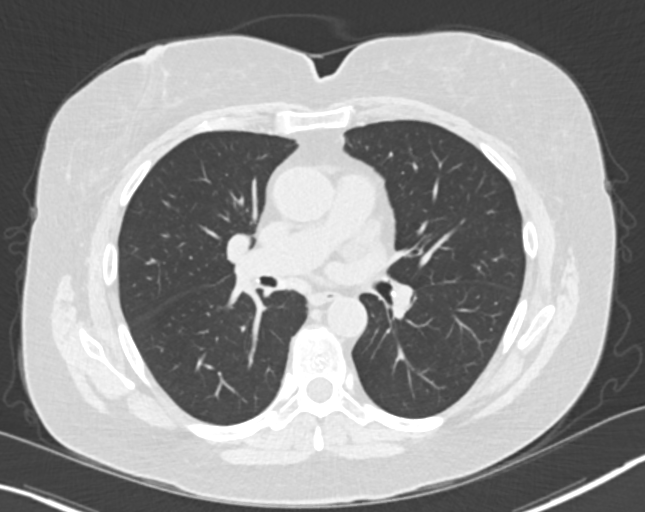
[im 100/162  lung]
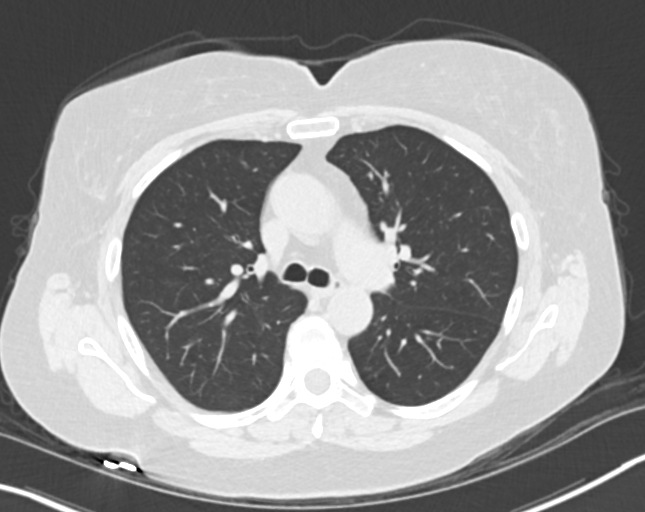
[im 112/162  lung]
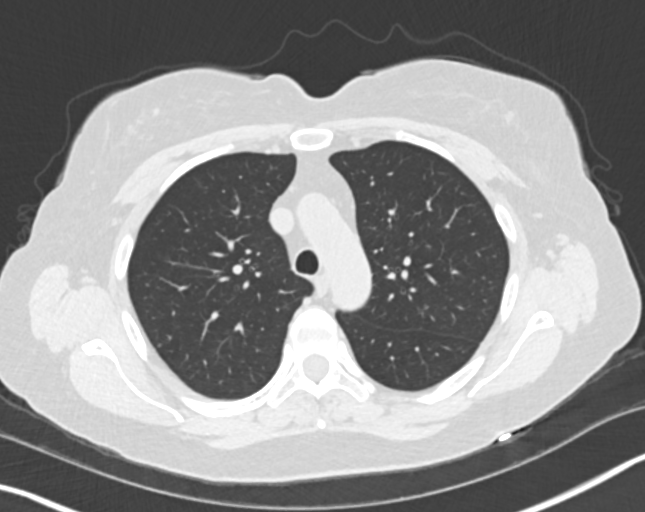
[im 137/162  lung]
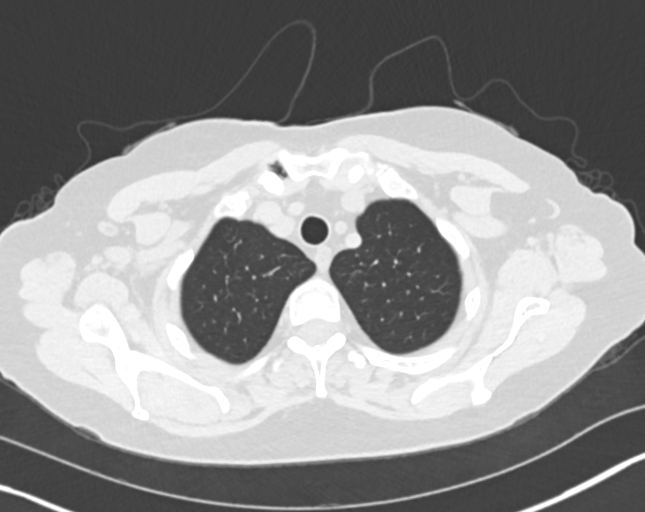
[im 149/162  mediastinal]
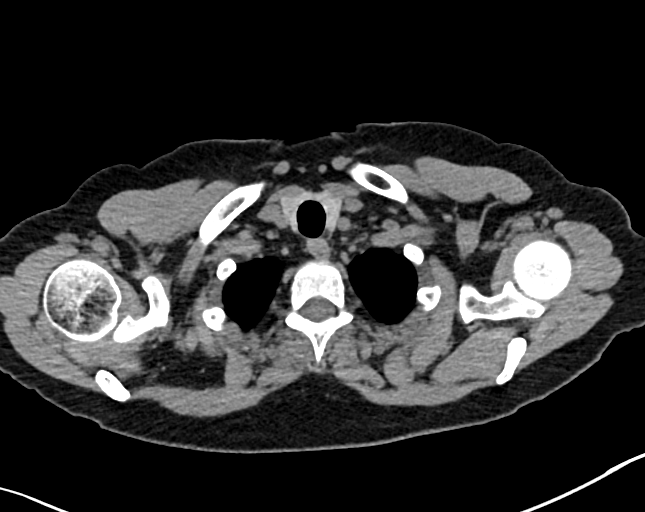
[im 149/162  lung]
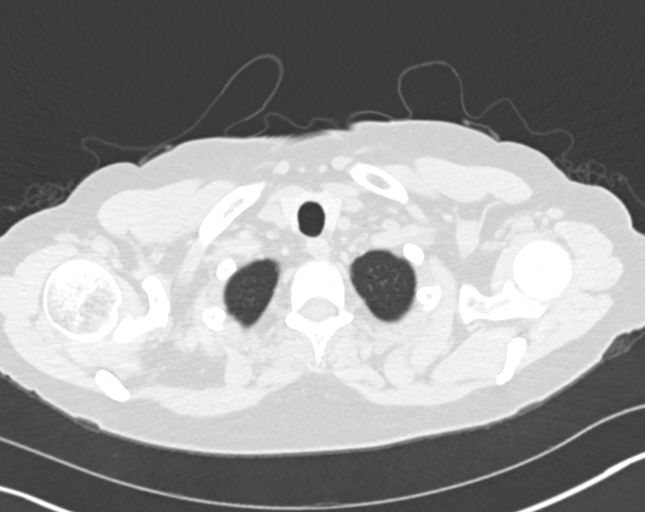

[Series 4: chest 2.00 br40 s3 · coronal · 0.64mm/px · 3 of 145 slices shown (2 of 2)]
[im 29/145  lung]
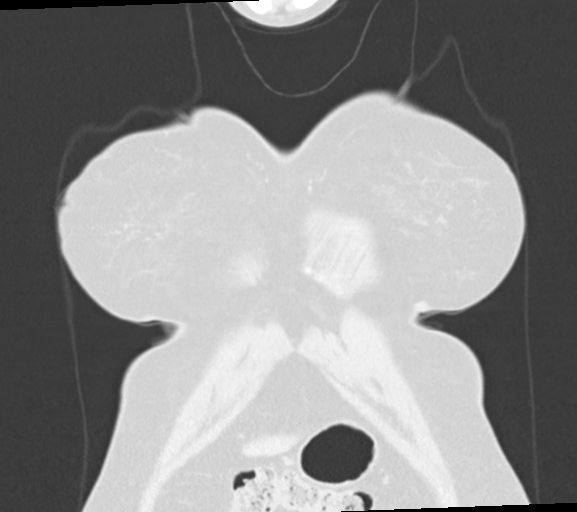
[im 58/145  lung]
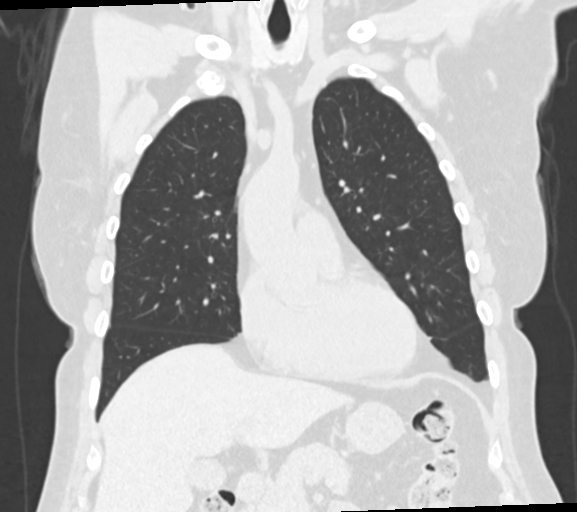
[im 87/145  lung]
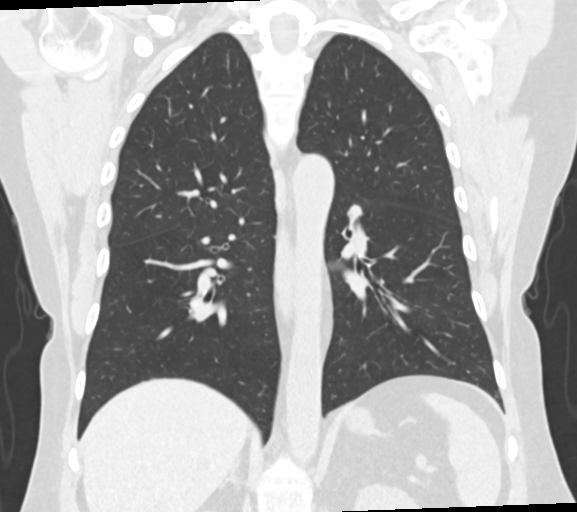

[12 of 36 positions shown; findings below may reference images not displayed]

FINDINGS: Cardiovascular: The heart size is normal. No substantial pericardial
effusion. Coronary artery calcification is evident. Mild
atherosclerotic calcification is noted in the wall of the thoracic
aorta.

Mediastinum/Nodes: No mediastinal lymphadenopathy. Calcified nodal
tissue is seen in the right hilum. The esophagus has normal imaging
features. There is no axillary lymphadenopathy.

Lungs/Pleura: Peribronchovascular nodularity identified peripheral
right upper lobe (image 35/8) a region of the lungs not included on
prior CT. 5 mm nodule in this region identified on 38/8.

Subtle focus of paraspinal tree-in-bud nodularity identified right
lower lobe on 74/8.

No substantial change in the right lower lobe pulmonary nodule
measuring 14 mm today on [DATE] compared to 14 mm previously when I
remeasure it on image 60 of series 9 on the prior exam in the same
dimension. As noted previously there is central calcification in the
nodule with adjacent linear opacity tracking centrally.

2 mm left lower lobe pulmonary nodule on 124/8 was not included on
the prior study. 4 mm posterior left lower lobe nodule measured
previously is seen on image 107/8 today and is unchanged, as is the
stable 3 mm peripheral left lower lobe nodule on 108/8. 4 mm left
lower lobe nodule on 81/8 is stable. Branching opacity in the
peripheral left lower lobe on 69/70 of series 8 suggest impacted
peripheral airway.

Stable scarring in the lingula.

Upper Abdomen: Unremarkable.

Musculoskeletal: No worrisome lytic or sclerotic osseous
abnormality.
IMPRESSION: 1. No substantial change in the 14 mm right lower lobe pulmonary
nodule with central calcification. Given the presence of calcified
nodal tissue in the mediastinum and right hilum, granuloma is
considered likely. Surveillance could be used to ensure continued
stability
2. Other scattered tiny bilateral pulmonary nodules are stable in
the interval. Attention on follow-up recommended
3. Multiple peripheral sites of branching opacity and
Peribronchovascular nodularity bilaterally. Imaging features likely
related to infectious/inflammatory etiology. Atypical infection
would be a consideration.
4. Aortic Atherosclerosis ([T2]-[T2]).

## 2021-06-11 ENCOUNTER — Encounter: Payer: Medicare Other | Admitting: Thoracic Surgery (Cardiothoracic Vascular Surgery)

## 2021-06-18 ENCOUNTER — Other Ambulatory Visit: Payer: Self-pay

## 2021-06-18 ENCOUNTER — Ambulatory Visit: Payer: Medicare Other | Admitting: Physician Assistant

## 2021-06-18 VITALS — BP 164/80 | HR 80 | Resp 20 | Ht 66.0 in | Wt 172.0 lb

## 2021-06-18 DIAGNOSIS — R911 Solitary pulmonary nodule: Secondary | ICD-10-CM | POA: Diagnosis not present

## 2021-06-18 DIAGNOSIS — R918 Other nonspecific abnormal finding of lung field: Secondary | ICD-10-CM

## 2021-06-18 NOTE — Progress Notes (Signed)
   Subjective:    Patient ID: Maria Love, female    DOB: [birth year], 70 y.o.   MRN: [id number]  HPI  Maria Love is a 70 yo lady who was incidentally found to have a 1.2 cm nodule in the RLL.  Work-up showed the nodule to have central calcification which was ultimately felt to be granulomatous disease.  She was last seen by Dr. Roxan Hockey on 02/28/2021.  Since her last visit she continues to do well.  She is having some mild allergy symptoms due to mold.  She denies chest pain, cough, hemoptysis, weight loss, night sweats, and shortness of breath.       Objective:   Physical Exam  BP (!) 164/80   Pulse 80   Resp 20   Ht 5\' 6"  (1.676 m)   Wt 172 lb (78 kg)   LMP  (LMP Unknown)   SpO2 97% Comment: RA  BMI 27.76 kg/m   Gen: no apparent distress Heart: RRR Lungs: CTA bilaterally Abd: soft non-tender, non-distended  CT Scan:  IMPRESSION: 1. No substantial change in the 14 mm right lower lobe pulmonary nodule with central calcification. Given the presence of calcified nodal tissue in the mediastinum and right hilum, granuloma is considered likely. Surveillance could be used to ensure continued stability 2. Other scattered tiny bilateral pulmonary nodules are stable in the interval. Attention on follow-up recommended 3. Multiple peripheral sites of branching opacity and Peribronchovascular nodularity bilaterally. Imaging features likely related to infectious/inflammatory etiology. Atypical infection would be a consideration. 4. Aortic Atherosclerosis (ICD10-I70.0).     Electronically Signed   By: Misty Stanley M.D.   On: 06/07/2021 11:32    Assessment & Plan:   Right Lower Lobe nodule, calcified and remains stable in size per CT scan... This is felt most likely to be granulomatous disease Seasonal Allergies HTN- will need to follow up with primary care phyisician Dispo- patient is doing well, continues to remain symptom free.  CT scan shows stable appearance  of 1.2 cm pulmonary nodule in RLL and tiny scattered pulmonary nodules.. Imaging reviewed with Dr. Roxan Hockey and as discussed with have patient RTC in 3 months with repeat CT scan of chest w/o contrast  Lauri Purdum, PA-C

## 2021-06-18 NOTE — Patient Instructions (Addendum)
Pulmonary Nodule ?A pulmonary nodule is a small, round growth of tissue in the lung. A nodule may be cancer, but most nodules are not cancer. ?What are the causes? ?Infection from a germ (bacteria, fungus, or virus), such as tuberculosis. ?Tissue that is cancer, such as: ?Cancer in the lung. ?Cancer that has spread to the lung from another part of the body. ?A growth of tissue (mass) that is not cancer. ?Swelling and irritation from conditions such as rheumatoid arthritis. ?Having blood vessels that are not normal in the lungs. ?What are the signs or symptoms? ?Many times, there are no symptoms. If you get symptoms, they normally have another cause, such as infection. ?How is this treated? ?Treatment depends on: ?If your nodule is cancer or if it is not cancer. ?What your risk of getting cancer is. ?Some nodules are not cancer. If this is the case for you, you may not need treatment. Your doctor may do tests to watch the nodule for changes. ?If the nodule is cancer: ?You will need tests, such as CT and PET scans. ?You may need treatment. This may include: ?Surgery. ?Treatment with high-energy X-rays (radiation therapy). ?Medicines. ?Some nodules need to be taken out. You may have a procedure to have the nodule taken out. During the procedure, your doctor will make a cut (incision) into your chest and take out the part of your lung that has the nodule. ?Follow these instructions at home: ?Take over-the-counter and prescription medicines only as told by your doctor. ?Do not smoke or use any products that contain nicotine or tobacco. If you need help quitting, ask your doctor. ?Keep all follow-up visits. ?Contact a doctor if: ?You have pain in your chest, back, or shoulder. ?You are short of breath or have trouble breathing when you are active. ?You get a cough. ?Your voice starts to sound raspy, breathy, or strained (hoarse), and you do not know why. ?You feel sick or more tired than normal. ?You do not feel like  eating. ?You lose weight without trying. ?You get chills, or you start to sweat a lot during sleep. ?You need two or more pillows to sleep on at night. ?You have: ?A fever and your symptoms get worse all of a sudden. ?A fever or symptoms for more than 2-3 days. ?Get help right away if: ?You cannot catch your breath. ?You have sudden chest pain. ?You start making high-pitched whistling sounds when you breathe, most often when you breathe out (you wheeze). ?You cannot stop coughing. ?You cough up blood or bloody mucus from your lungs (sputum). ?You get dizzy or feel like you may faint. ?These symptoms may represent a serious problem that is an emergency. Do not wait to see if the symptoms will go away. Get medical help right away. Call your local emergency services (911 in the [place] Do not drive yourself to the hospital. ?Summary ?A pulmonary nodule is a small, round growth of tissue in the lung. Most of these nodules are not cancer. ?Common causes of nodules in the lung include infection, swelling and irritation, and growths that are not cancer. ?Treatment depends on whether the nodule is cancer or is not cancer. Treatment also depends on your risk of getting cancer. ?If the nodule is cancer, you will need certain tests and treatments as told by your doctor. ?This information is not intended to replace advice given to you by your health care provider. Make sure you discuss any questions you have with your health care provider. ?Document Revised:   03/28/2020 Document Reviewed: 03/28/2020 ?Elsevier Patient Education ? 2022 Elsevier Inc. ? ?

## 2021-07-12 ENCOUNTER — Ambulatory Visit
Admission: RE | Admit: 2021-07-12 | Discharge: 2021-07-12 | Disposition: A | Discharge status: Home / Self Care | Payer: Medicare Other | Source: Ambulatory Visit | Attending: Internal Medicine | Admitting: Internal Medicine

## 2021-07-12 ENCOUNTER — Other Ambulatory Visit: Payer: Self-pay

## 2021-07-12 DIAGNOSIS — Z1231 Encounter for screening mammogram for malignant neoplasm of breast: Secondary | ICD-10-CM

## 2021-07-12 IMAGING — MG MM DIGITAL SCREENING BILAT W/ TOMO AND CAD
8 series · 8 of 24 positions shown · non-contrast
Comparison: None.
COMPARISON: None.

Addendum:
CLINICAL DATA: Screening.

EXAM:
DIGITAL SCREENING BILATERAL MAMMOGRAM WITH TOMOSYNTHESIS AND CAD
TECHNIQUE: Bilateral screening digital craniocaudal and mediolateral oblique
mammograms were obtained. Bilateral screening digital breast
tomosynthesis was performed. The images were evaluated with
computer-aided detection.

[R MLO synth-2D]
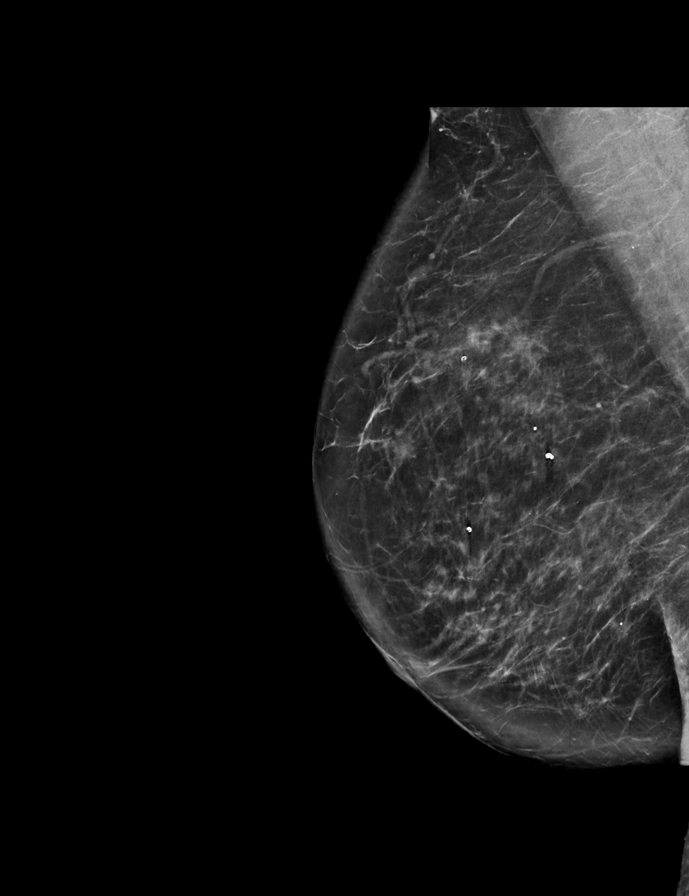

[L MLO synth-2D]
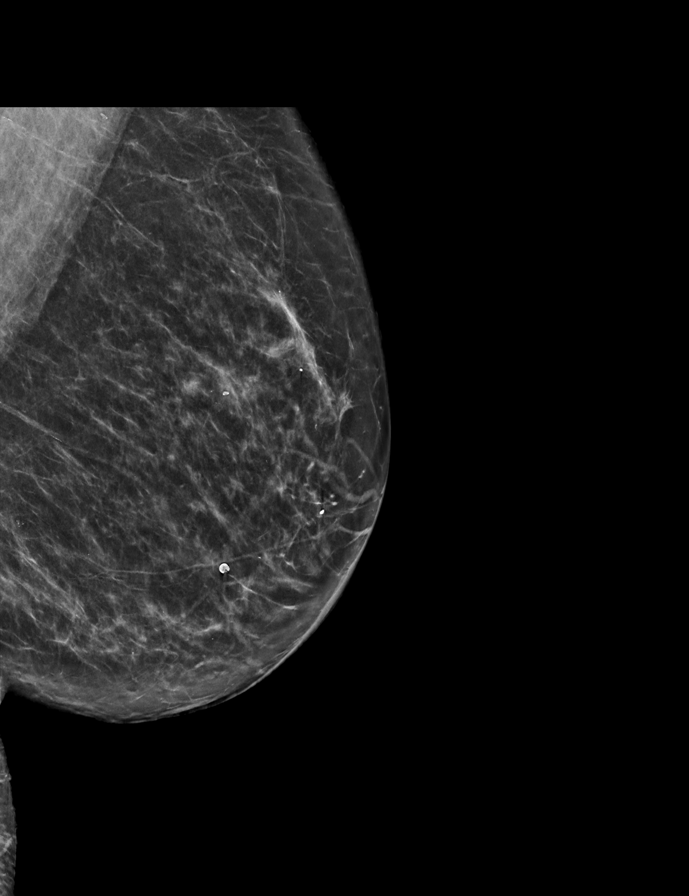

[R CC synth-2D]
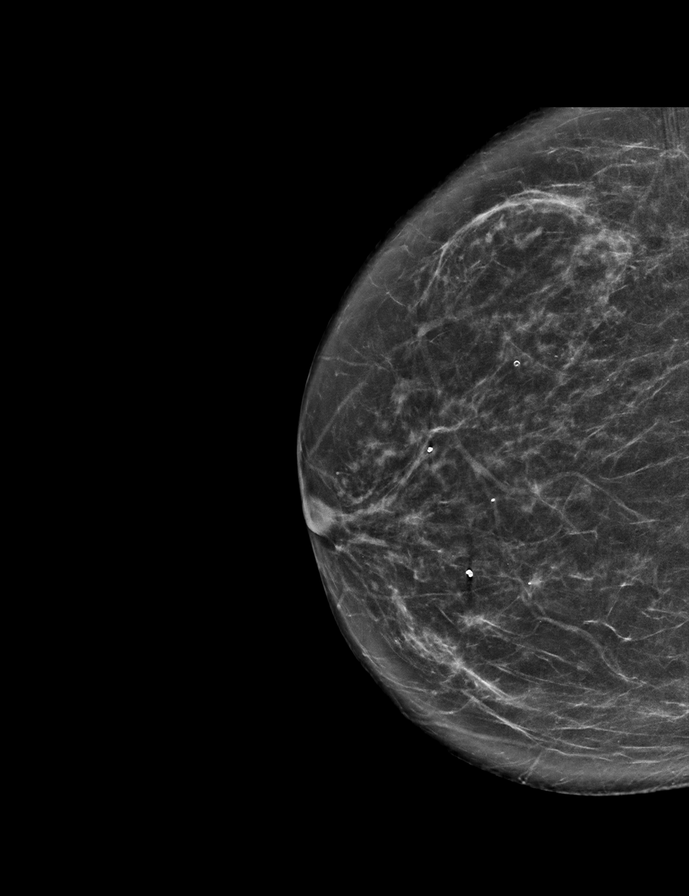

[L CC synth-2D]
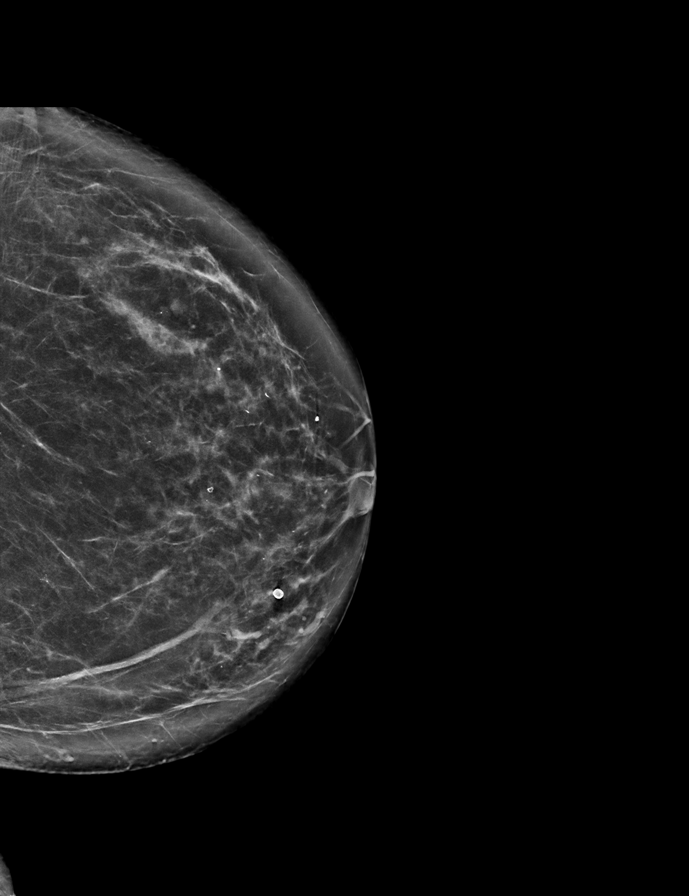

[L CC tomo · tomo slice 39/77.0]
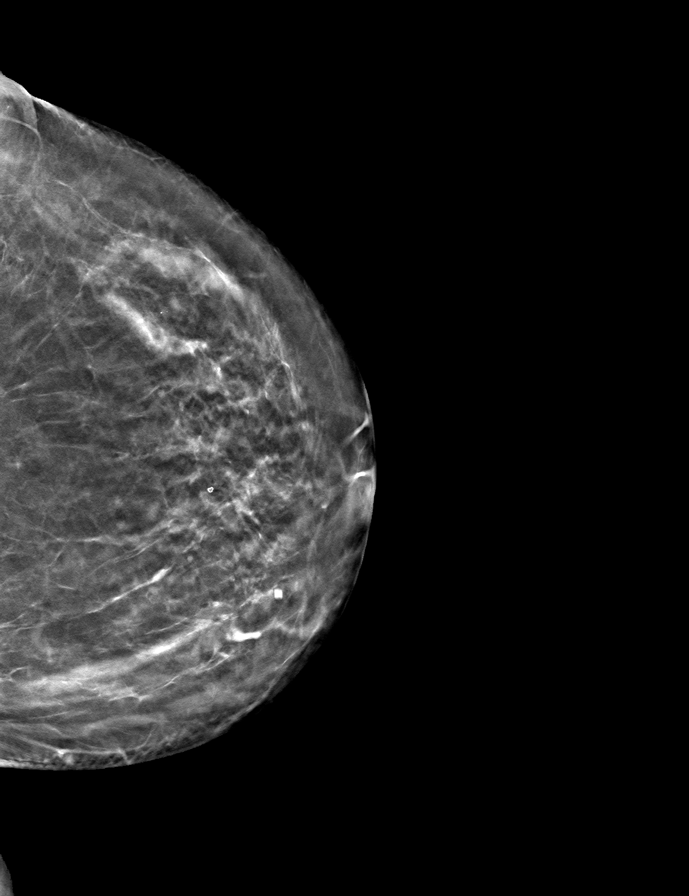

[L MLO tomo · tomo slice 37/73.0]
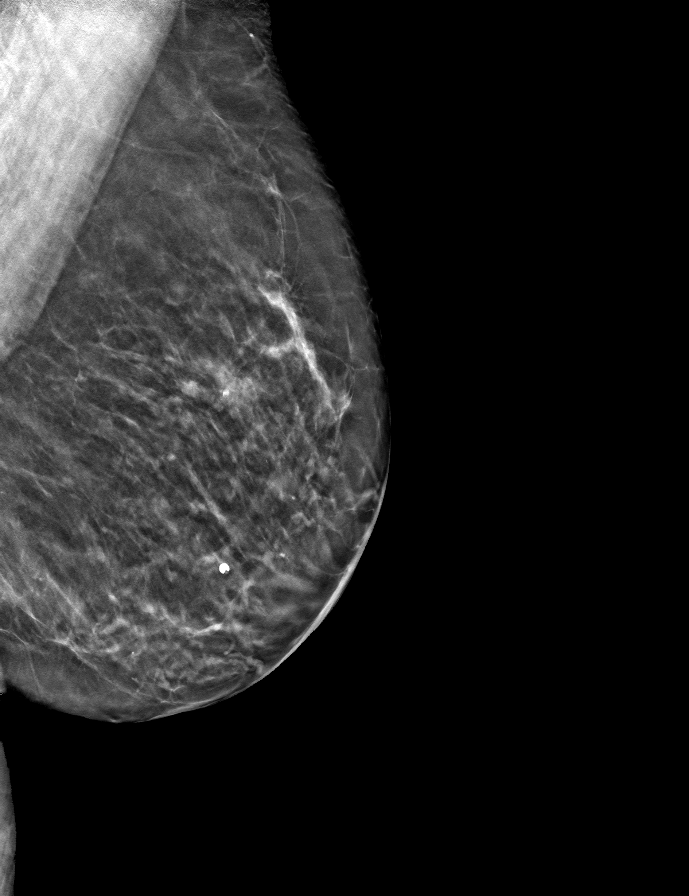

[R MLO tomo · tomo slice 41/81.0]
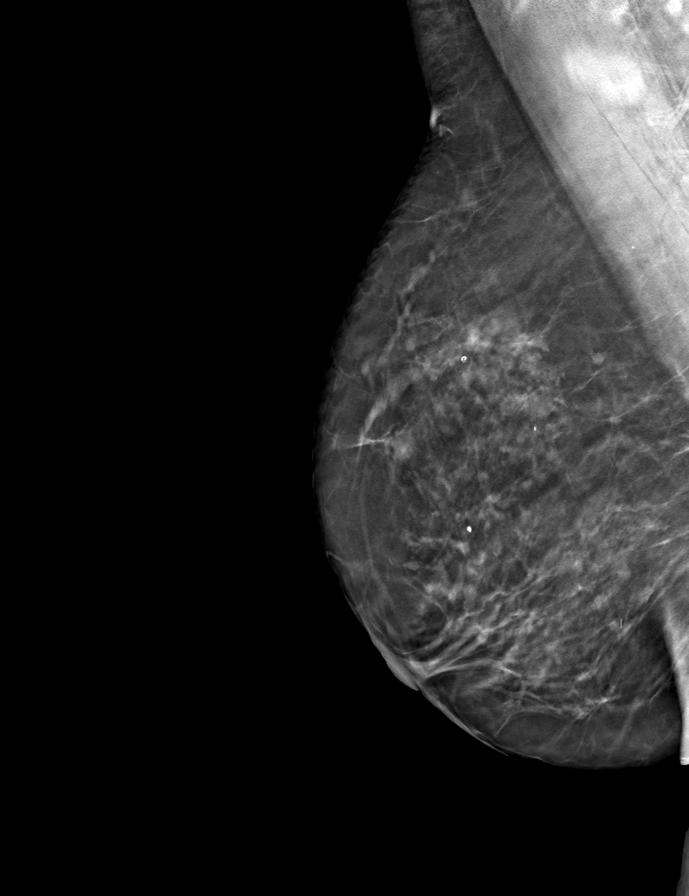

[R CC tomo · tomo slice 33/66.0]
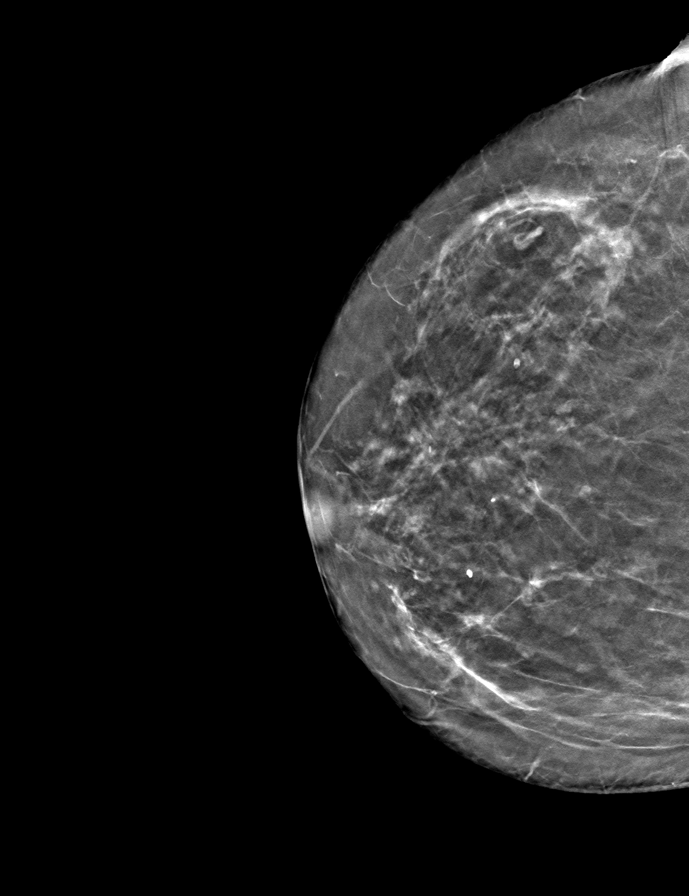

[8 of 24 positions shown; findings below may reference images not displayed]

ACR Breast Density Category b: There are scattered areas of
fibroglandular density.
FINDINGS: In the right breast, a possible mass warrants further evaluation.
Also in the right breast, a possible asymmetry with calcifications
warrants further evaluation. In the left breast, no findings
suspicious for malignancy.
IMPRESSION: Further evaluation is suggested for a possible mass, as well as a
separate possible asymmetry with calcifications, in the right
breast.

RECOMMENDATION:
Diagnostic mammogram and possibly ultrasound of the right breast.
(Code:[H0])

The patient will be contacted regarding the findings, and additional
imaging will be scheduled.

BI-RADS CATEGORY  0: Incomplete. Need additional imaging evaluation
and/or prior mammograms for comparison.

ADDENDUM:
Patient's prior exam from [DATE] has become available for
comparison. The possible mass in the right breast noted on the
current screening study appears new and warrants additional
evaluation. The calcifications may be stable, but this is not
definitive, and magnification views of these are also still
recommended.

No change to the current impression, recommendation or BI-RADS
category.

*** End of Addendum ***
ACR Breast Density Category b: There are scattered areas of
fibroglandular density.
FINDINGS: In the right breast, a possible mass warrants further evaluation.
Also in the right breast, a possible asymmetry with calcifications
warrants further evaluation. In the left breast, no findings
suspicious for malignancy.
IMPRESSION: Further evaluation is suggested for a possible mass, as well as a
separate possible asymmetry with calcifications, in the right
breast.

RECOMMENDATION:
Diagnostic mammogram and possibly ultrasound of the right breast.
(Code:[H0])

The patient will be contacted regarding the findings, and additional
imaging will be scheduled.

BI-RADS CATEGORY  0: Incomplete. Need additional imaging evaluation
and/or prior mammograms for comparison.

## 2021-08-02 ENCOUNTER — Other Ambulatory Visit: Payer: Self-pay | Admitting: Physician Assistant

## 2021-08-02 ENCOUNTER — Other Ambulatory Visit: Payer: Self-pay | Admitting: *Deleted

## 2021-08-02 DIAGNOSIS — R911 Solitary pulmonary nodule: Secondary | ICD-10-CM

## 2021-08-06 ENCOUNTER — Other Ambulatory Visit: Payer: Self-pay | Admitting: Internal Medicine

## 2021-08-06 DIAGNOSIS — R928 Other abnormal and inconclusive findings on diagnostic imaging of breast: Secondary | ICD-10-CM

## 2021-08-07 ENCOUNTER — Other Ambulatory Visit: Payer: Self-pay | Admitting: Cardiology

## 2021-08-07 DIAGNOSIS — E7800 Pure hypercholesterolemia, unspecified: Secondary | ICD-10-CM

## 2021-09-05 ENCOUNTER — Other Ambulatory Visit: Payer: Self-pay

## 2021-09-05 ENCOUNTER — Other Ambulatory Visit: Payer: Self-pay | Admitting: Internal Medicine

## 2021-09-05 ENCOUNTER — Ambulatory Visit
Admission: RE | Admit: 2021-09-05 | Discharge: 2021-09-05 | Disposition: A | Discharge status: Home / Self Care | Payer: Medicare Other | Source: Ambulatory Visit | Attending: Internal Medicine | Admitting: Internal Medicine

## 2021-09-05 DIAGNOSIS — R928 Other abnormal and inconclusive findings on diagnostic imaging of breast: Secondary | ICD-10-CM

## 2021-09-05 IMAGING — US US BREAST*R* LIMITED INC AXILLA
1 series · 9 of 9 positions shown · non-contrast
Comparison: Prior exams, including current screening study,
[DATE], and previous exams, the next most recent of which is
dated [DATE].

CLINICAL DATA: Screening recall for a possible right breast mass as
well as right breast calcifications and possible asymmetry.

EXAM:
DIGITAL DIAGNOSTIC UNILATERAL RIGHT MAMMOGRAM WITH TOMOSYNTHESIS AND
CAD; ULTRASOUND RIGHT BREAST LIMITED
TECHNIQUE: Right digital diagnostic mammography and breast tomosynthesis was
performed. The images were evaluated with computer-aided detection.;
Targeted ultrasound examination of the right breast was performed

[Series 1: us breast*right* limited inc axilla · 0.06mm/px · 9 of 9 slices shown]
[im 1/9]
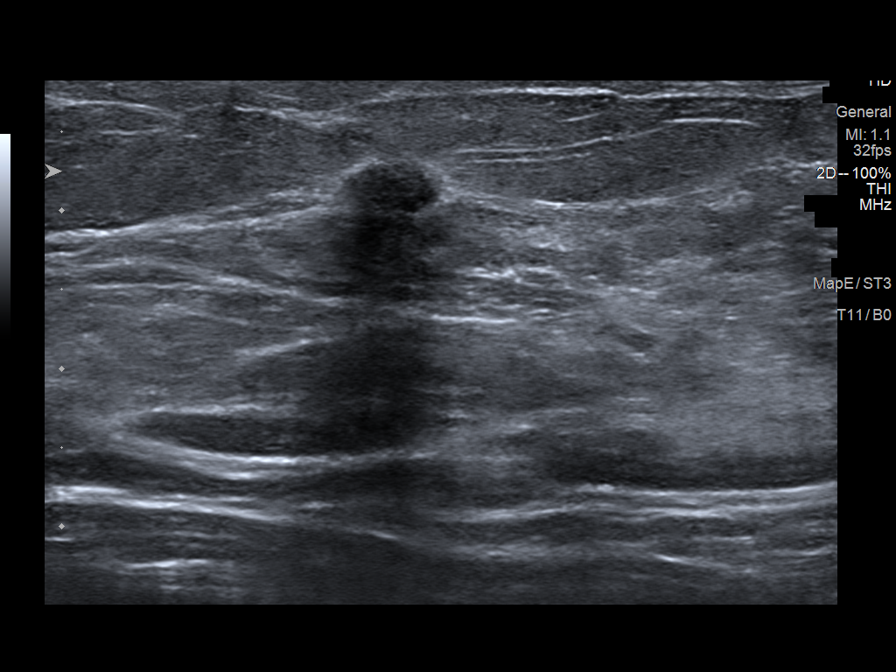
[im 2/9]
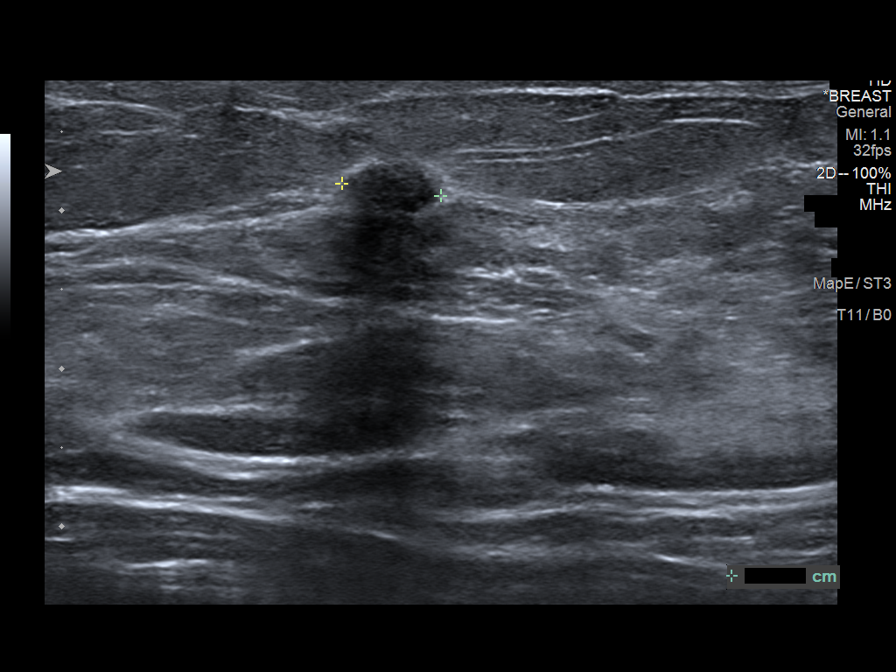
[im 3/9]
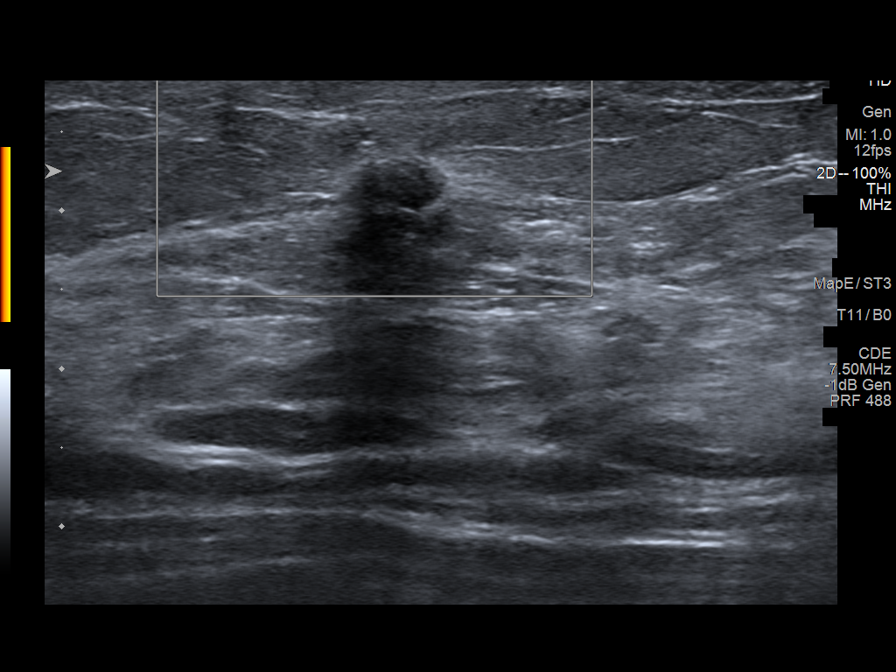
[im 4/9]
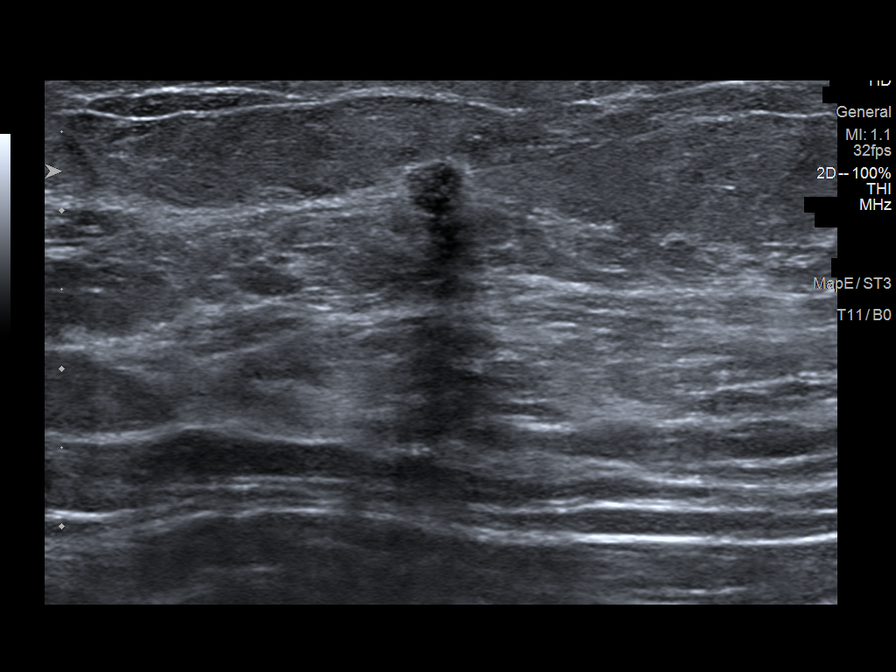
[im 5/9]
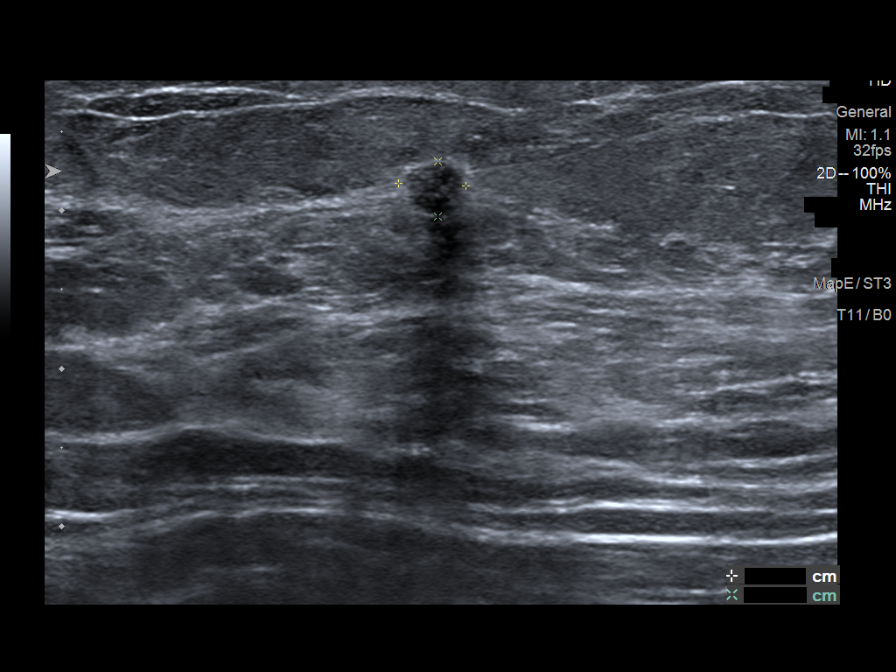
[im 6/9]
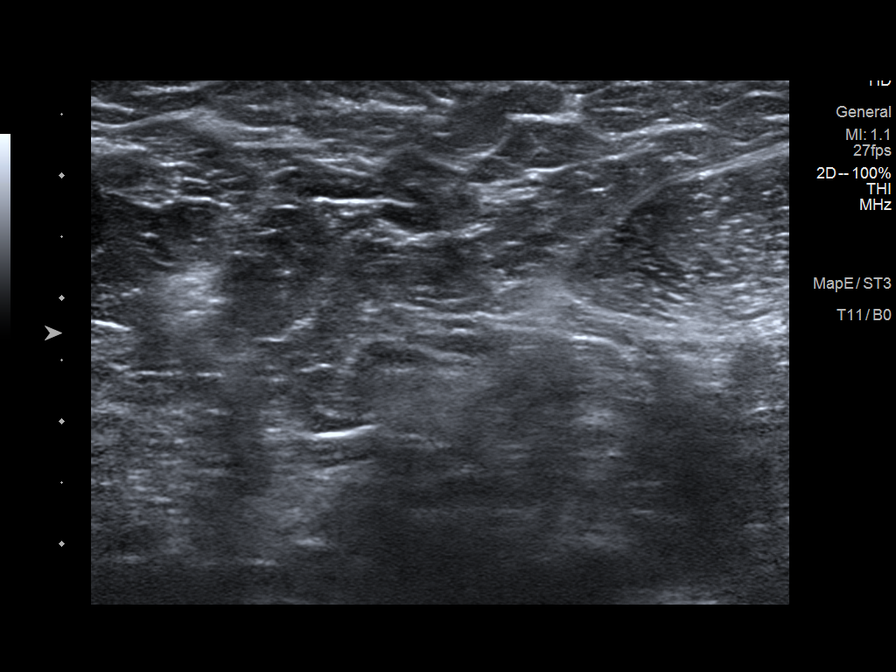
[im 7/9]
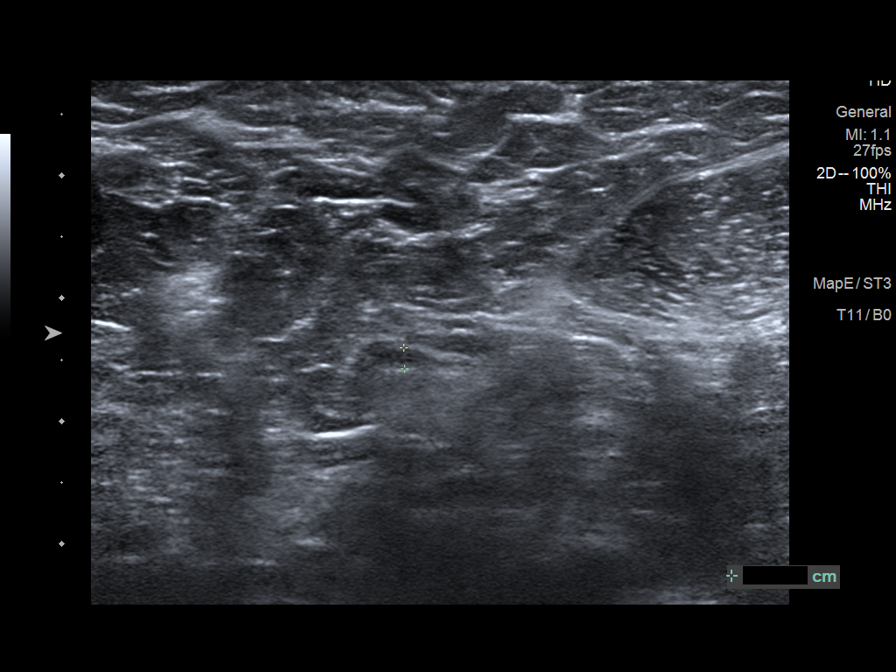
[im 8/9]
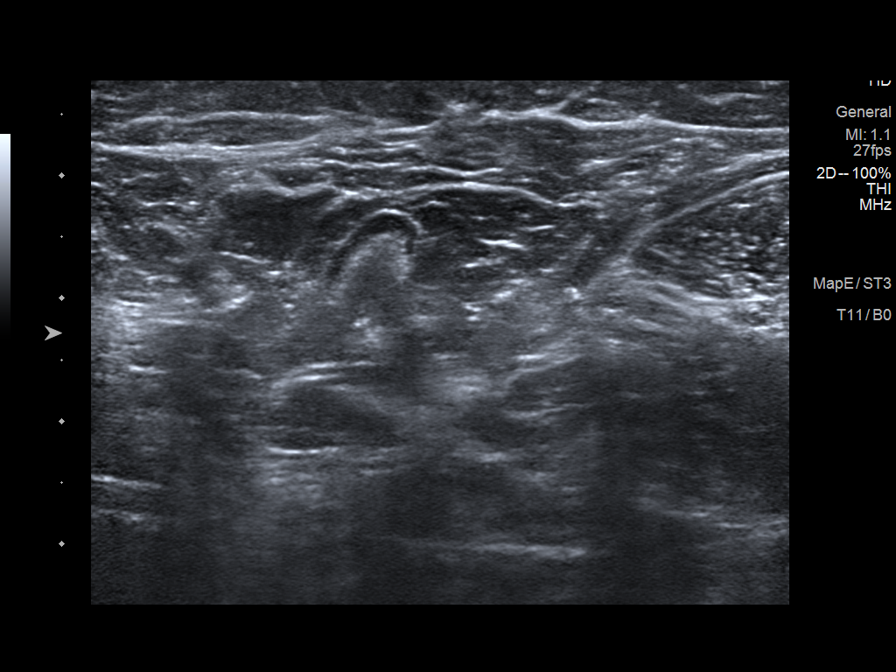
[im 9/9]
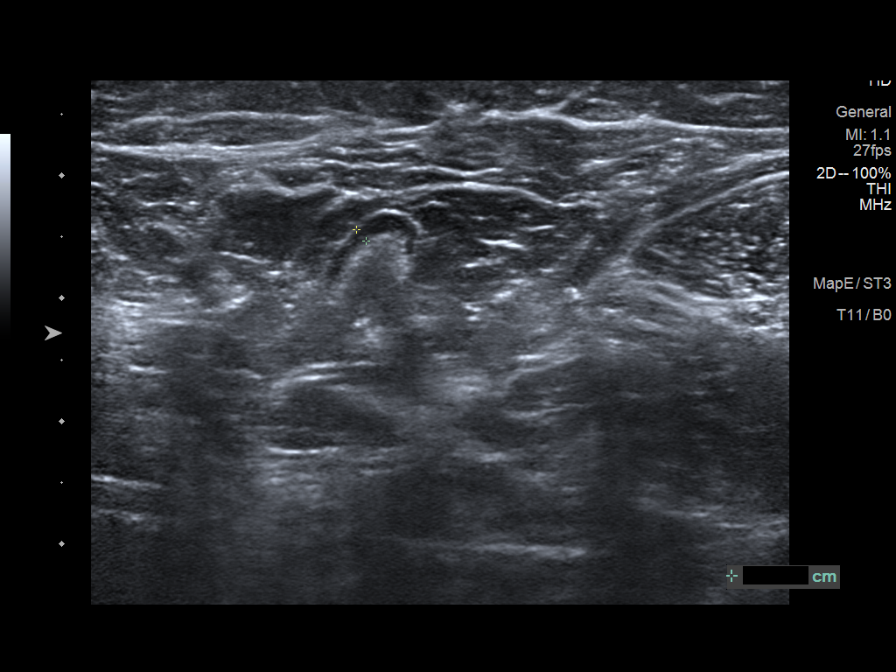

[9 of 9 positions shown; findings below may reference images not displayed]

ACR Breast Density Category b: There are scattered areas of
fibroglandular density.
FINDINGS: The possible mass noted in the upper outer right breast persists on
the diagnostic spot-compression images as a small spiculated mass,
6-7 mm in size.

Screening exam demonstrate calcifications and associated asymmetry
in the outer right breast. Asymmetry disperses on spot compression
imaging consistent with superimposed fibroglandular tissue. On
magnification images, the calcifications appear more diffuse lump,
small rounded configuration, with no convincing linearity. There is
no associated mass or distortion, and there are similar
calcifications scattered elsewhere within the fibroglandular tissue
of both breasts.

On physical exam, no mass is palpated in the upper outer right
breast.

Targeted right breast ultrasound is performed, showing a small
hypoechoic mass with partly ill-defined margins, at 11 o'clock, 3 cm
the nipple, measuring 6 x 4 x 4 mm. The mass demonstrates posterior
acoustic shadowing. It is consistent in size, shape and location to
the mammographic mass. Sonographic evaluation of the right axilla
demonstrates normal lymph nodes. There are no enlarged or abnormal
lymph nodes.
IMPRESSION: 1. Highly suspicious 6 mm mass in the right breast at 11 o'clock.
Tissue sampling is recommended. No evidence of right axillary
metastatic lymphadenopathy.
2. Scattered benign bilateral breast calcifications. No suspicious
calcifications.

RECOMMENDATION:
1. Ultrasound-guided core needle biopsy of the small right breast
mass at 11 o'clock. This was scheduled for [DATE].

I have discussed the findings and recommendations with the patient.
If applicable, a reminder letter will be sent to the patient
regarding the next appointment.

BI-RADS CATEGORY  5: Highly suggestive of malignancy.

## 2021-09-17 ENCOUNTER — Other Ambulatory Visit: Payer: Self-pay

## 2021-09-17 ENCOUNTER — Ambulatory Visit
Admission: RE | Admit: 2021-09-17 | Discharge: 2021-09-17 | Disposition: A | Discharge status: Home / Self Care | Payer: Medicare Other | Source: Ambulatory Visit | Attending: Internal Medicine | Admitting: Internal Medicine

## 2021-09-17 DIAGNOSIS — C50919 Malignant neoplasm of unspecified site of unspecified female breast: Secondary | ICD-10-CM

## 2021-09-17 DIAGNOSIS — R928 Other abnormal and inconclusive findings on diagnostic imaging of breast: Secondary | ICD-10-CM

## 2021-09-17 HISTORY — DX: Malignant neoplasm of unspecified site of unspecified female breast: C50.919

## 2021-09-17 IMAGING — MG MM BREAST LOCALIZATION CLIP
4 series · 4 of 12 positions shown · non-contrast
Comparison: Previous exam(s).
COMPARISON: Previous exam(s).

Addendum:
CLINICAL DATA: Evaluate biopsy marker

EXAM:
3D DIAGNOSTIC LEFT MAMMOGRAM POST ULTRASOUND BIOPSY

[R ML synth-2D]
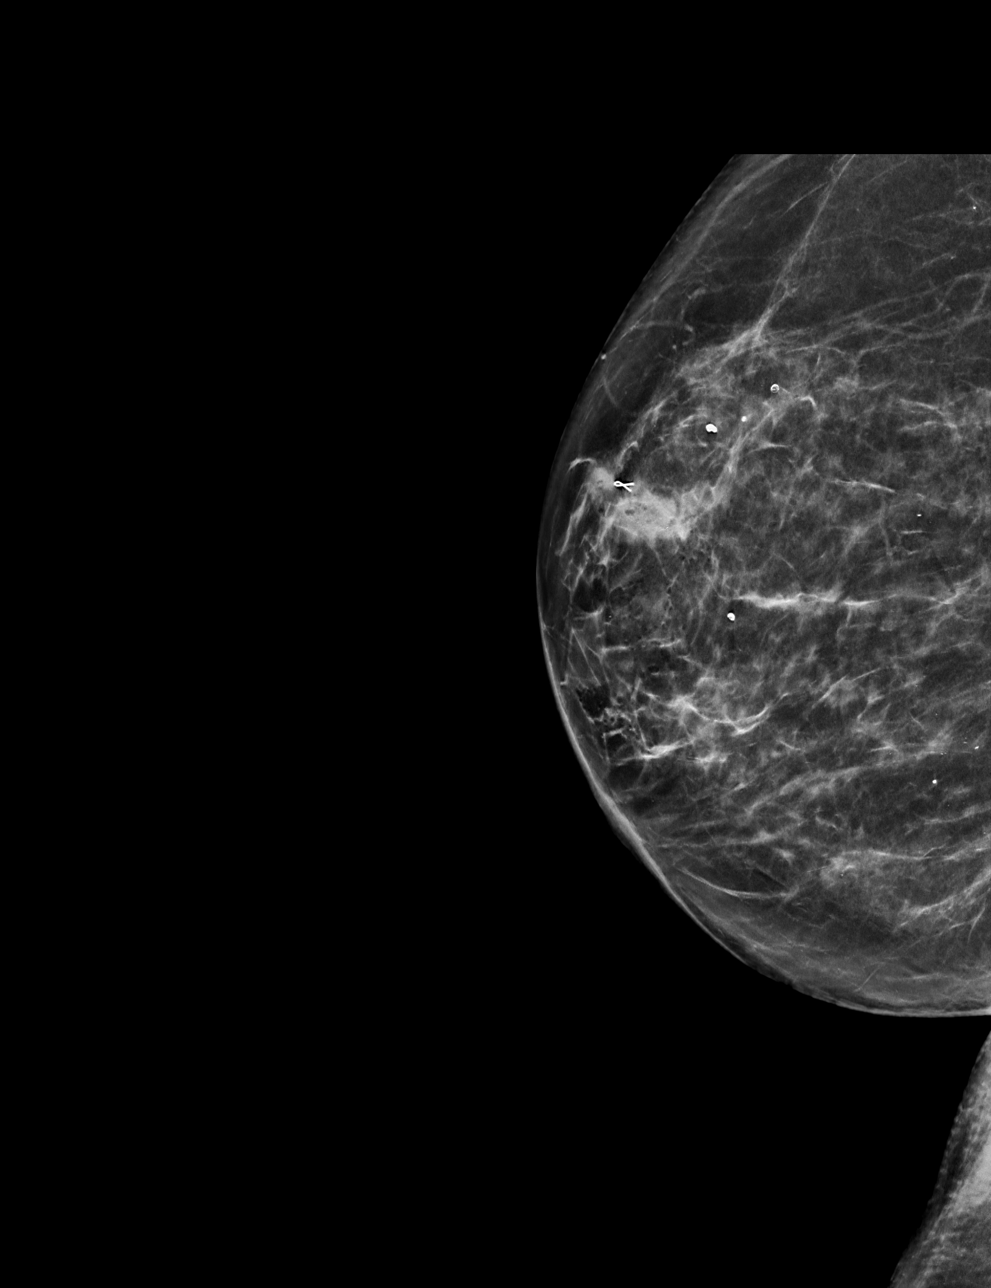

[R CC synth-2D]
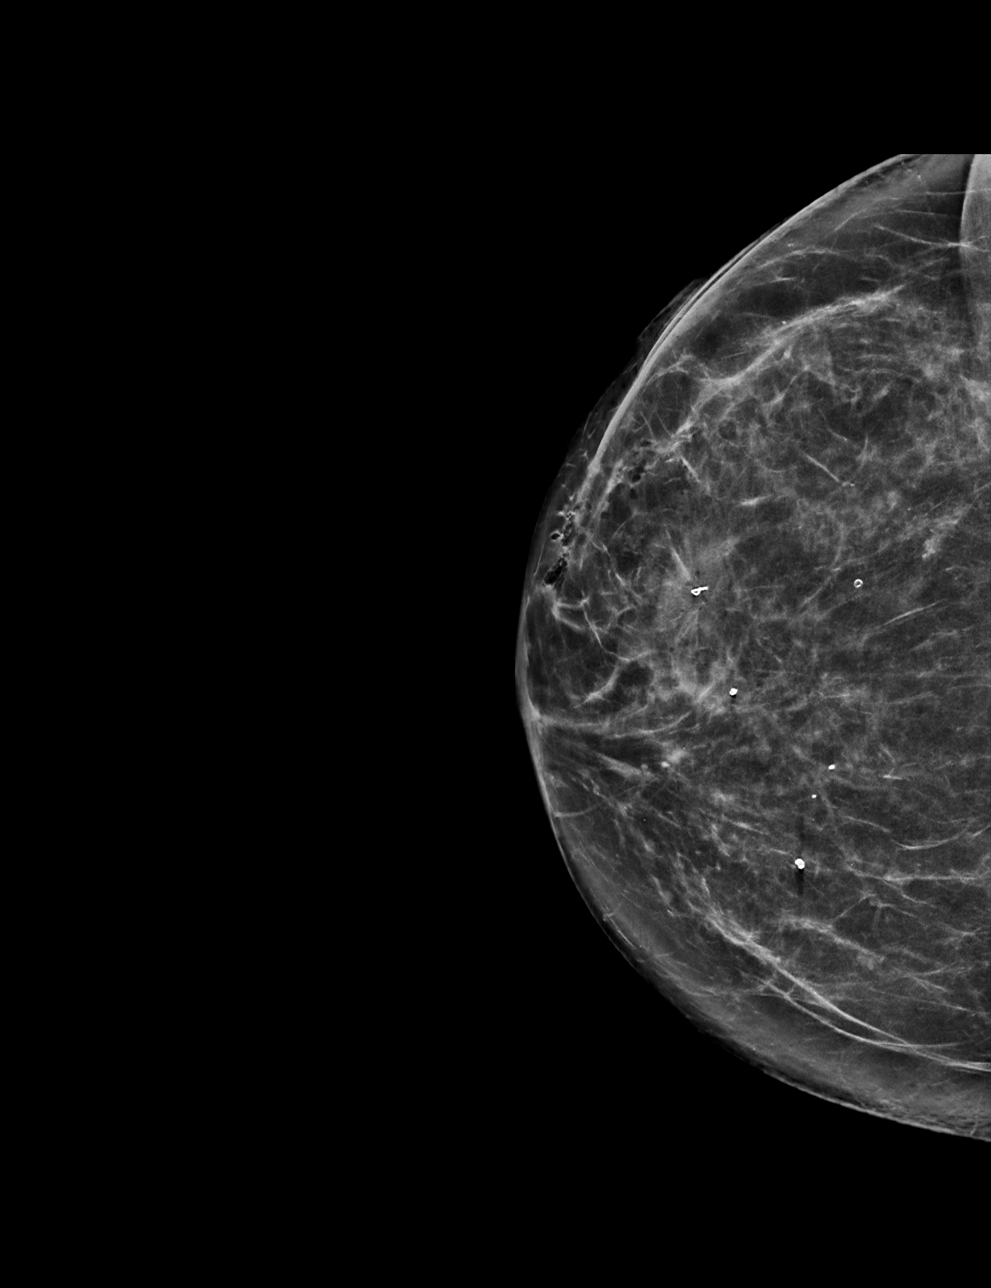

[R CC tomo · tomo slice 35/68.0]
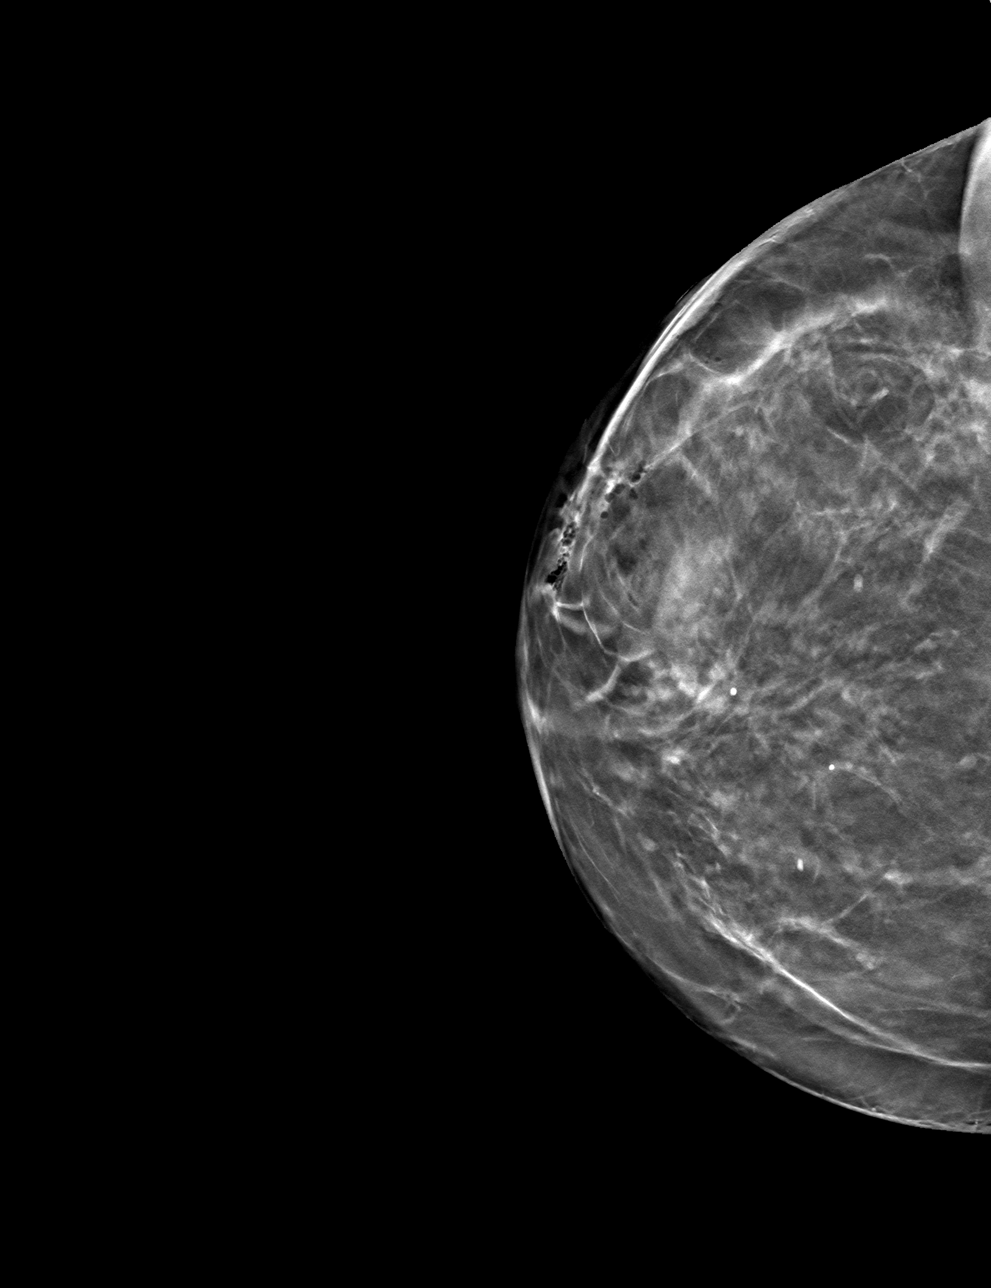

[R ML tomo · tomo slice 34/67.0]
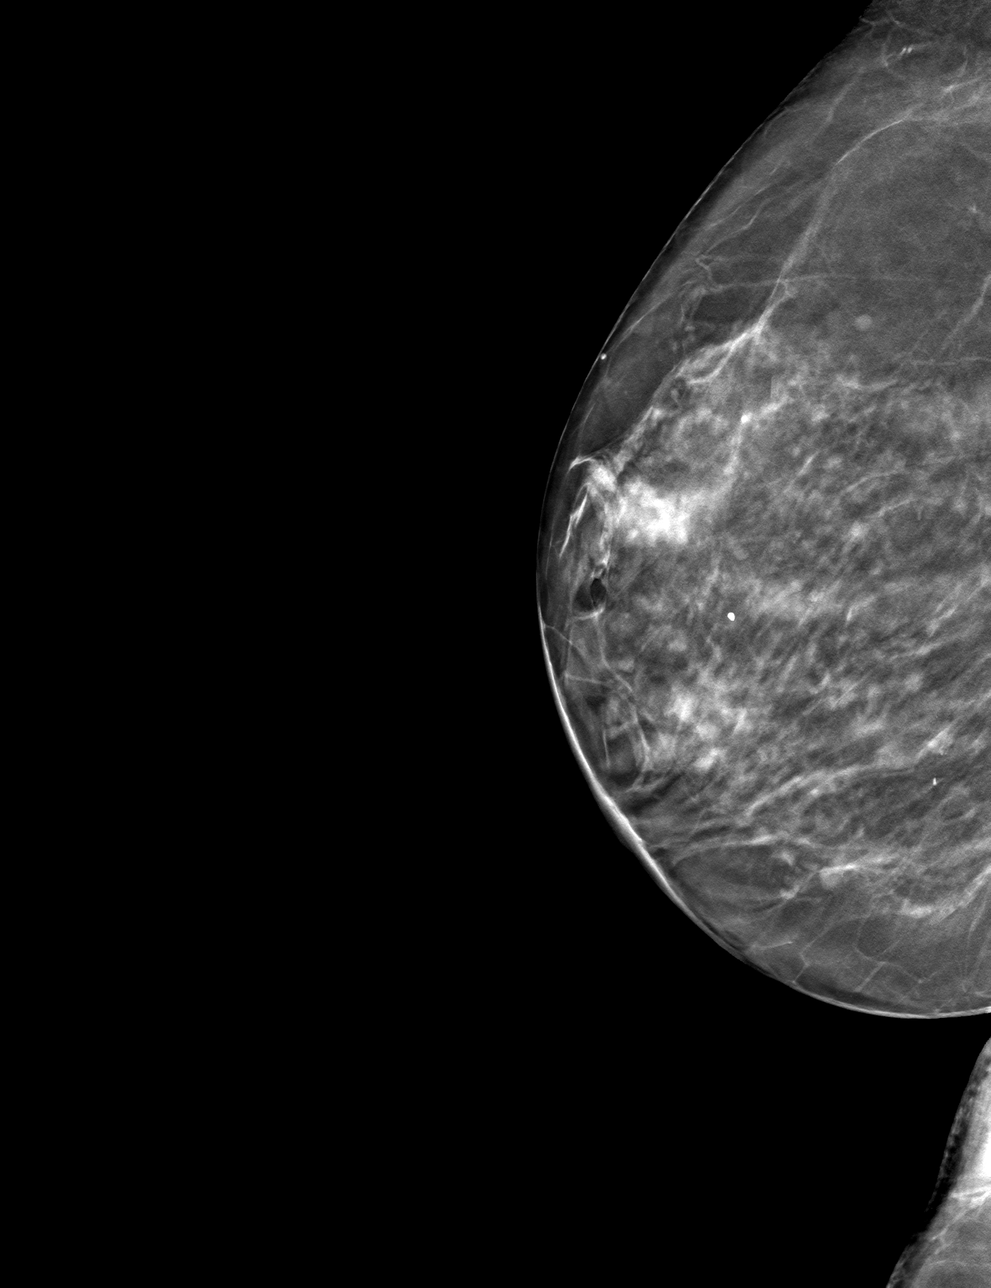

[4 of 12 positions shown; findings below may reference images not displayed]

FINDINGS: 3D Mammographic images were obtained following ultrasound guided
biopsy of a right breast mass. The biopsy marking clip is along the
superolateral aspect of the biopsied mass.
IMPRESSION: Appropriate positioning of the ribbon shaped biopsy marking clip at
along the superolateral aspect of the biopsied right breast mass.

Final Assessment: Post Procedure Mammograms for Marker Placement

ADDENDUM:
This was a right mammogram, not a left.

*** End of Addendum ***
FINDINGS: 3D Mammographic images were obtained following ultrasound guided
biopsy of a right breast mass. The biopsy marking clip is along the
superolateral aspect of the biopsied mass.
IMPRESSION: Appropriate positioning of the ribbon shaped biopsy marking clip at
along the superolateral aspect of the biopsied right breast mass.

Final Assessment: Post Procedure Mammograms for Marker Placement

## 2021-09-17 IMAGING — US US  BREAST BX W/ LOC DEV 1ST LESION IMG BX SPEC US GUIDE*R*
1 series · 9 of 9 positions shown · non-contrast
Comparison: Previous exam(s).
COMPARISON: Previous exam(s).

Addendum:
CLINICAL DATA: Biopsy of an 11 o'clock right breast mass

EXAM:
ULTRASOUND GUIDED RIGHT BREAST CORE NEEDLE BIOPSY

[Series 1: us breast bx w/ loc dev 1st lesion img bx spec us  · 0.07mm/px · 9 of 9 slices shown]
[im 1/9]
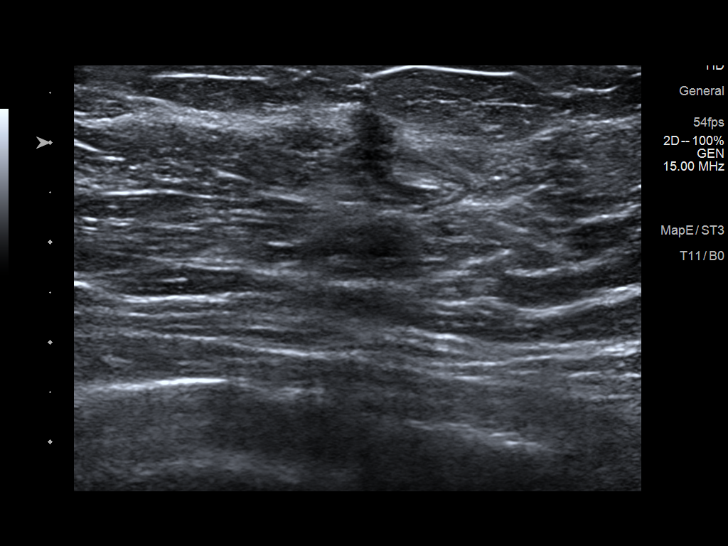
[im 2/9]
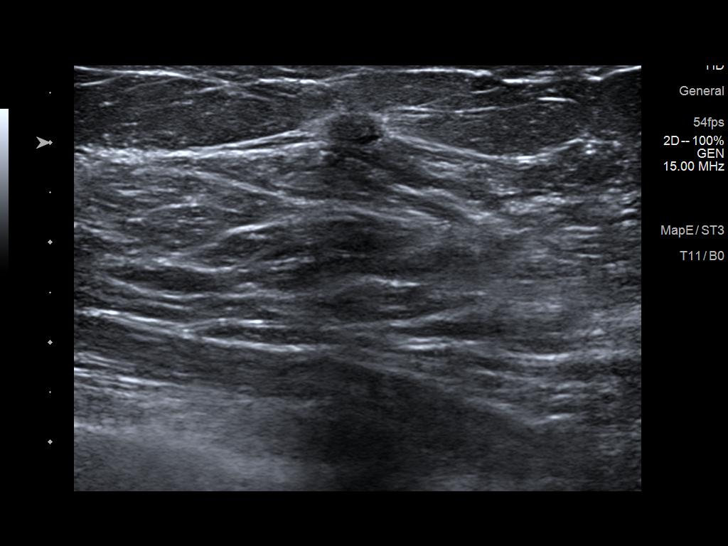
[im 3/9]
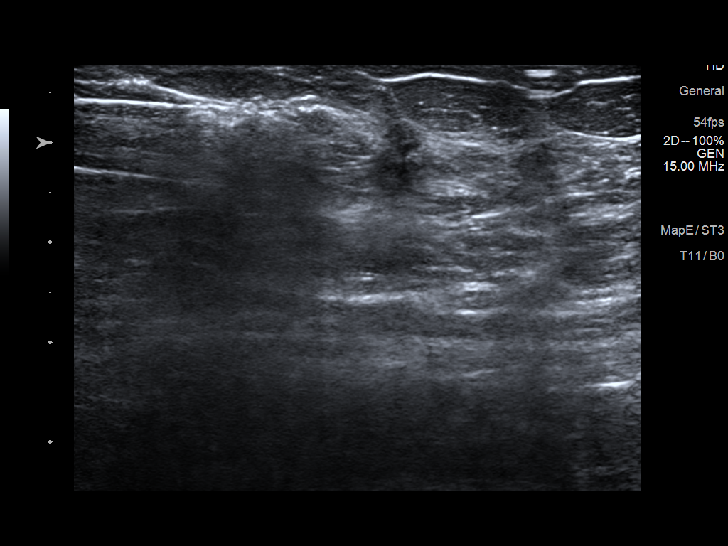
[im 4/9]
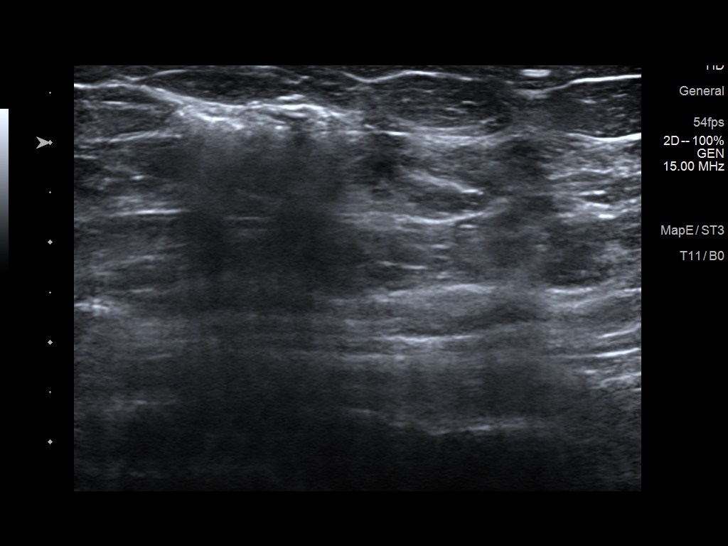
[im 5/9]
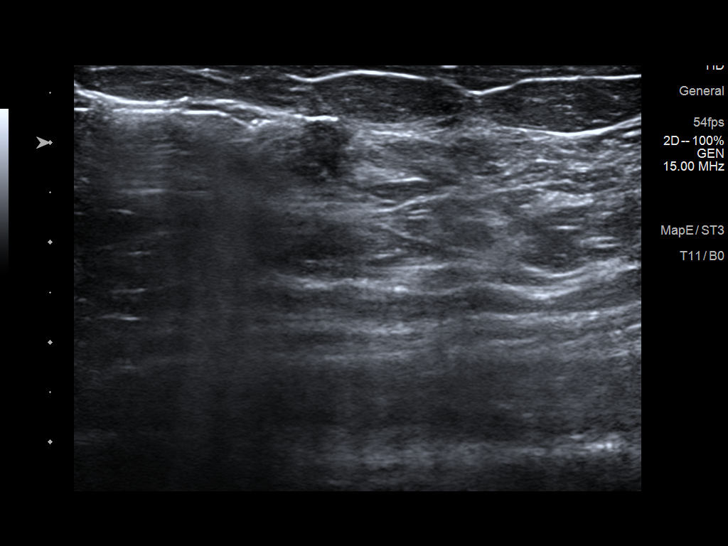
[im 6/9]
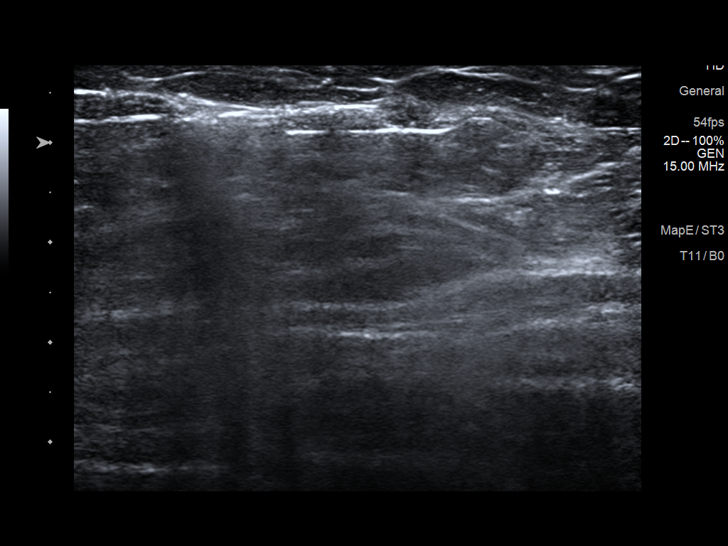
[im 7/9]
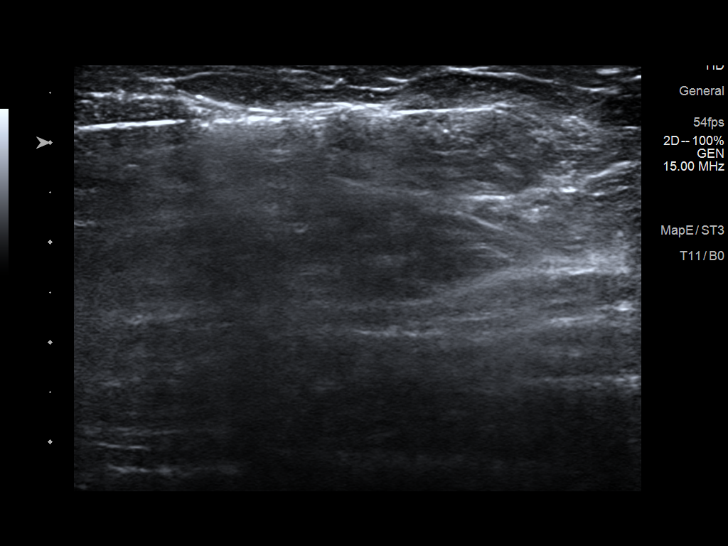
[im 8/9]
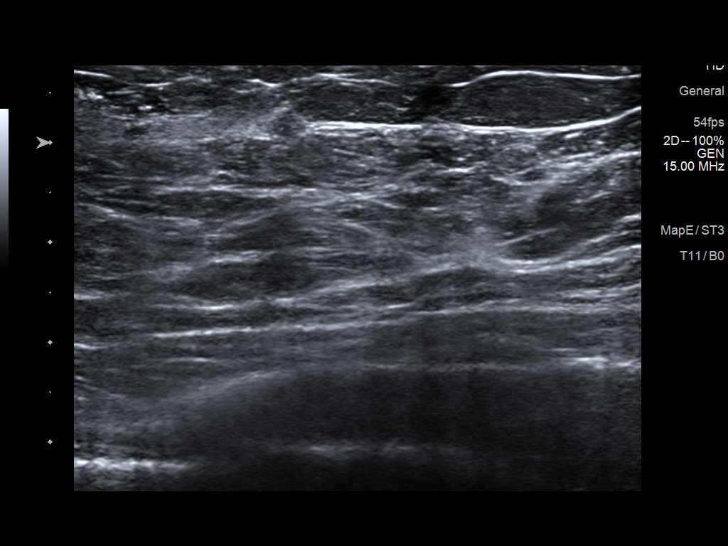
[im 9/9]
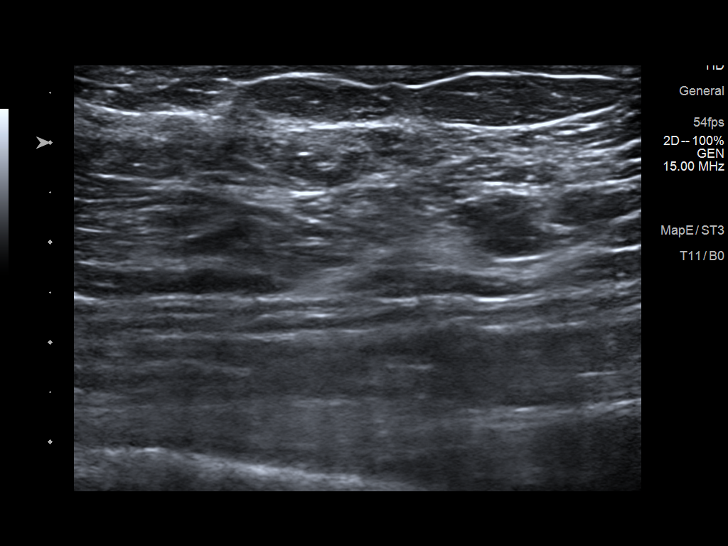

[9 of 9 positions shown; findings below may reference images not displayed]



Lesion quadrant: 11 o'clock right breast

Using sterile technique and 1% Lidocaine as local anesthetic, under
direct ultrasound visualization, a 12 gauge HERA device was
used to perform biopsy of an 11 o'clock right breast mass using a
lateral approach. At the conclusion of the procedure a ribbon shaped
tissue marker clip was deployed into the biopsy cavity. Follow up 2
view mammogram was performed and dictated separately.
IMPRESSION: Ultrasound guided biopsy of an 11 o'clock right breast mass. No
apparent complications.

ADDENDUM:
Pathology revealed GRADE II INVASIVE MAMMARY CARCINOMA of the RIGHT
breast, 11 o'clock, (ribbon clip). This was found to be concordant
by Dr. HERA.

Pathology results were discussed with the patient by telephone. The
patient reported doing well after the biopsy with tenderness and
bruising at the site. Post biopsy instructions and care were
reviewed and questions were answered. The patient was encouraged to
call The [REDACTED] for any additional
concerns. My direct phone number was provided.

Surgical consultation has been arranged with Dr. HERA at
[REDACTED] on [DATE].

Pathology results reported by HERA, RN on [DATE].



Lesion quadrant: 11 o'clock right breast

Using sterile technique and 1% Lidocaine as local anesthetic, under
direct ultrasound visualization, a 12 gauge HERA device was
used to perform biopsy of an 11 o'clock right breast mass using a
lateral approach. At the conclusion of the procedure a ribbon shaped
tissue marker clip was deployed into the biopsy cavity. Follow up 2
view mammogram was performed and dictated separately.
IMPRESSION: Ultrasound guided biopsy of an 11 o'clock right breast mass. No
apparent complications.

## 2021-09-20 ENCOUNTER — Ambulatory Visit
Admission: RE | Admit: 2021-09-20 | Discharge: 2021-09-20 | Disposition: A | Discharge status: Home / Self Care | Payer: Medicare Other | Source: Ambulatory Visit | Attending: Internal Medicine | Admitting: Internal Medicine

## 2021-09-20 ENCOUNTER — Ambulatory Visit
Admission: RE | Admit: 2021-09-20 | Discharge: 2021-09-20 | Disposition: A | Discharge status: Home / Self Care | Payer: Medicare Other | Source: Ambulatory Visit | Attending: Physician Assistant | Admitting: Physician Assistant

## 2021-09-20 DIAGNOSIS — E2839 Other primary ovarian failure: Secondary | ICD-10-CM

## 2021-09-20 DIAGNOSIS — R911 Solitary pulmonary nodule: Secondary | ICD-10-CM

## 2021-09-20 IMAGING — CT CT CHEST W/O CM
2 of 5 series · 15 of 36 positions shown, 18 images · non-contrast
Comparison: [DATE].

CLINICAL DATA: A 70-year-old female presents with history of breast
cancer for lung nodule follow-up. New diagnosis of breast cancer on
the RIGHT.

EXAM:
CT CHEST WITHOUT CONTRAST
TECHNIQUE: Multidetector CT imaging of the chest was performed following the
standard protocol without IV contrast.

[Series 4: chest 2.00 br40 s3 · coronal · 0.67mm/px · 3 of 146 slices shown]
[im 30/146  lung]
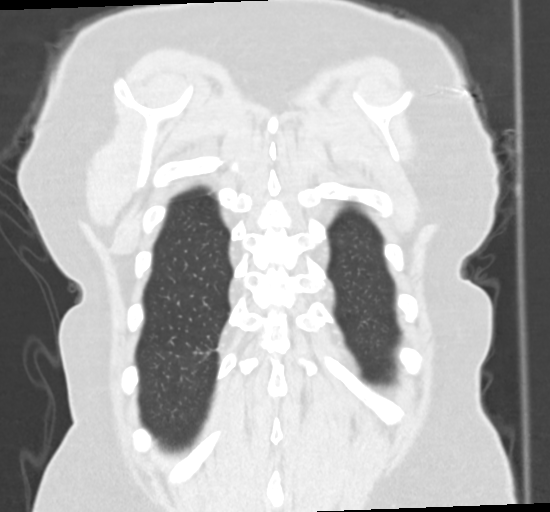
[im 59/146  lung]
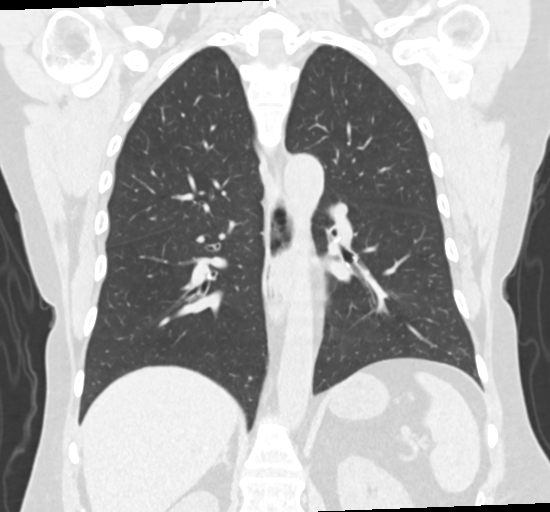
[im 88/146  lung]
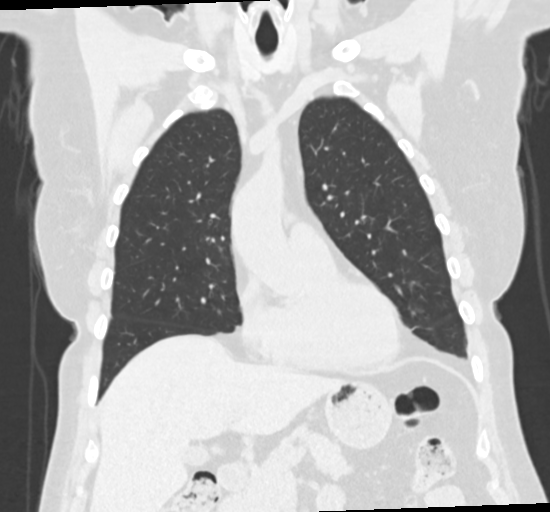

[Series 10: chest 1.00 br40 s3 super d · axial · 0.72mm/px · z∈[+1408,+1702]mm · 12 of 426 slices shown, 15 images]
[im 29/426  mediastinal]
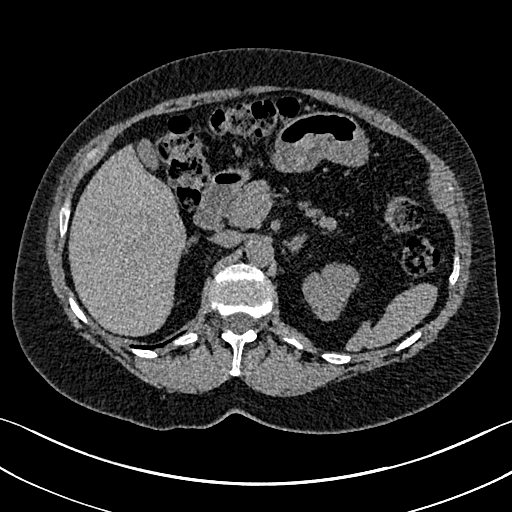
[im 29/426  lung]
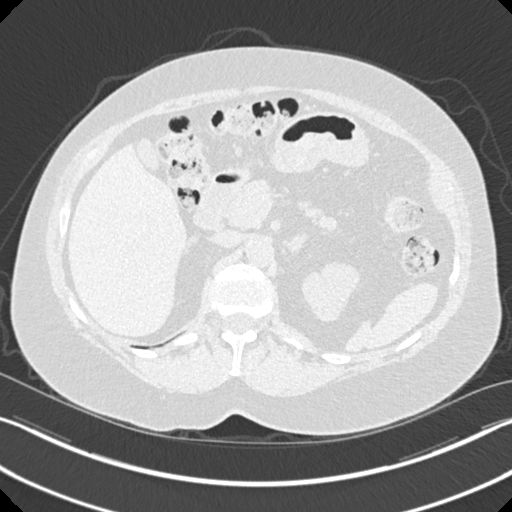
[im 57/426  lung]
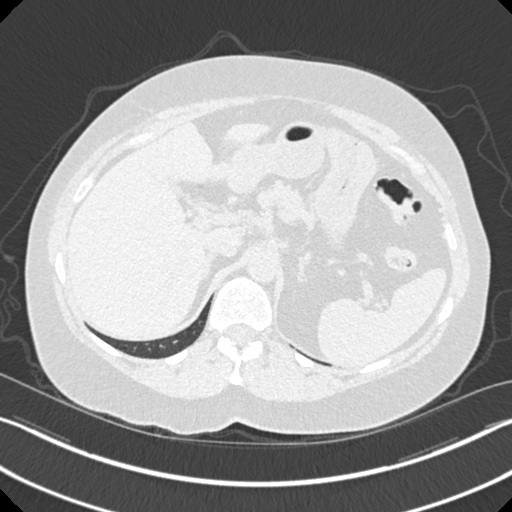
[im 86/426  lung]
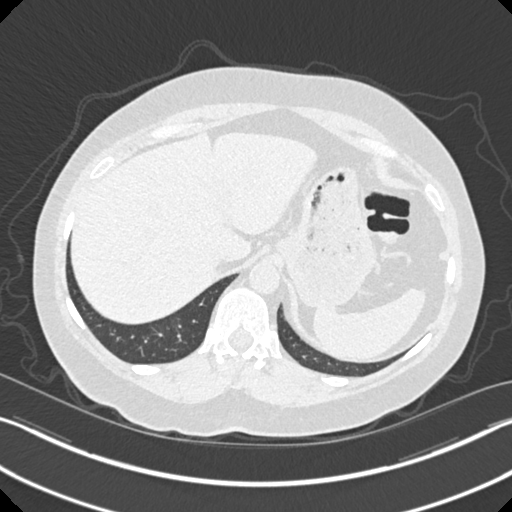
[im 142/426  lung]
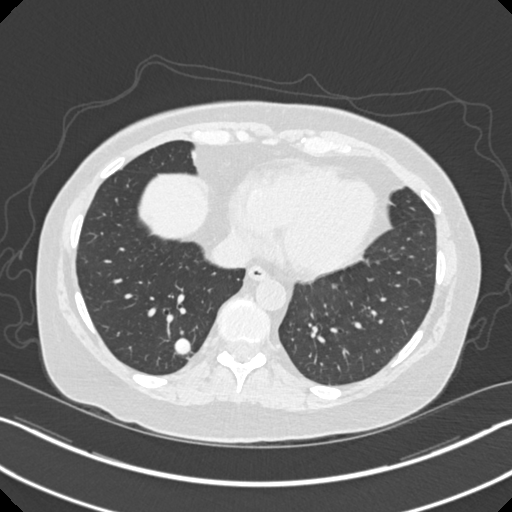
[im 171/426  mediastinal]
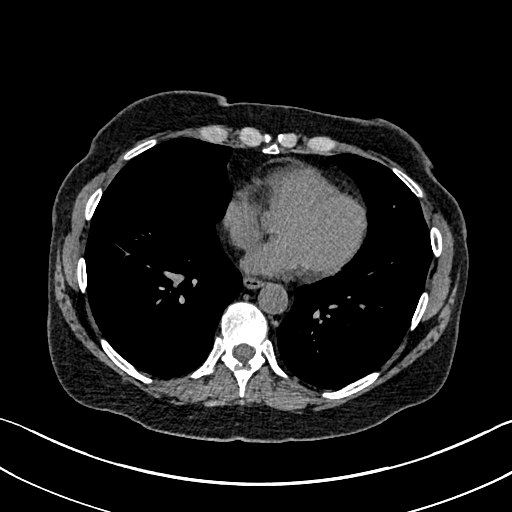
[im 171/426  lung]
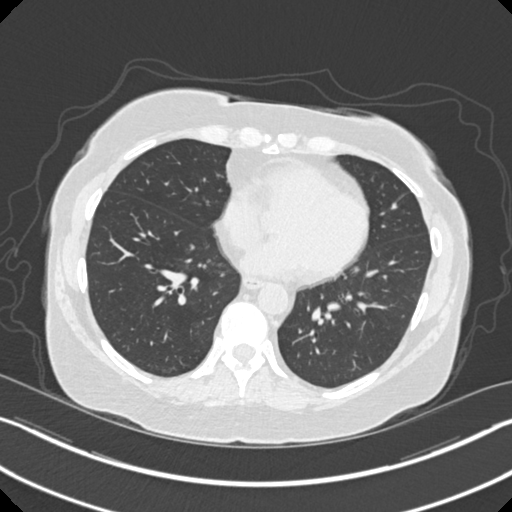
[im 199/426  lung]
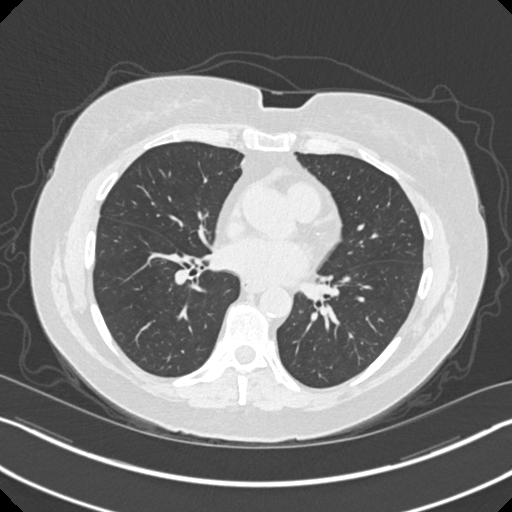
[im 227/426  lung]
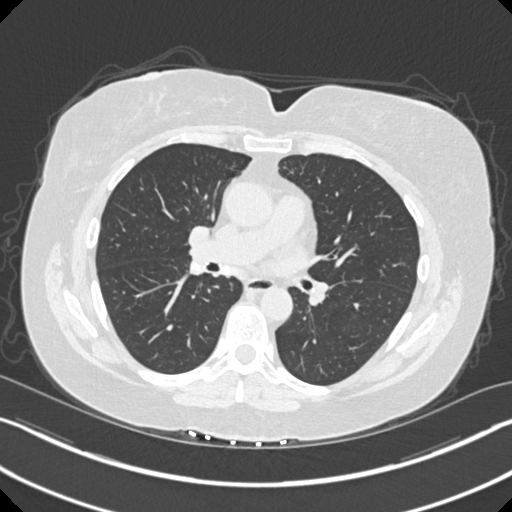
[im 256/426  lung]
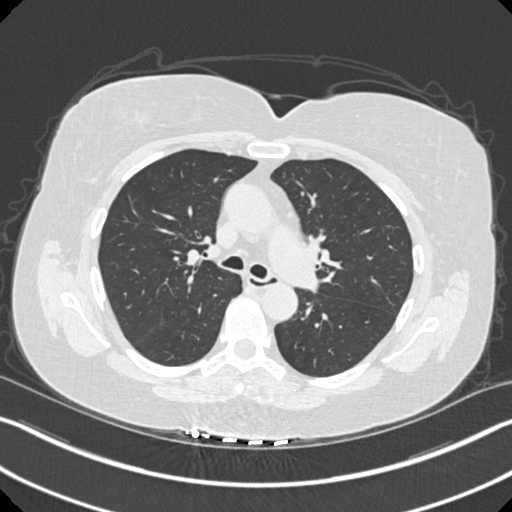
[im 284/426  mediastinal]
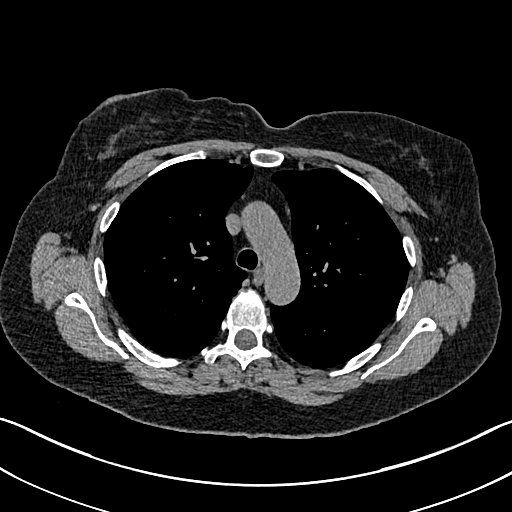
[im 284/426  lung]
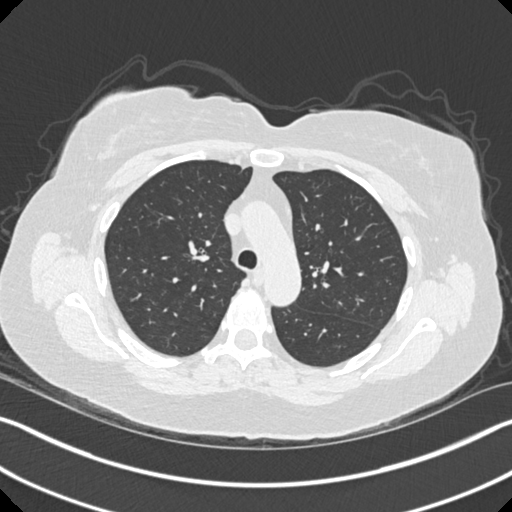
[im 341/426  lung]
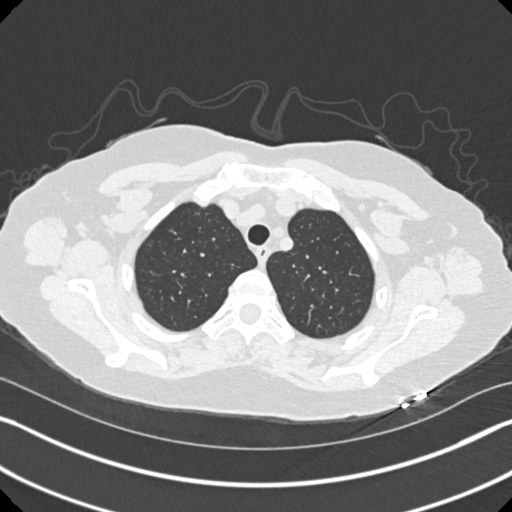
[im 369/426  lung]
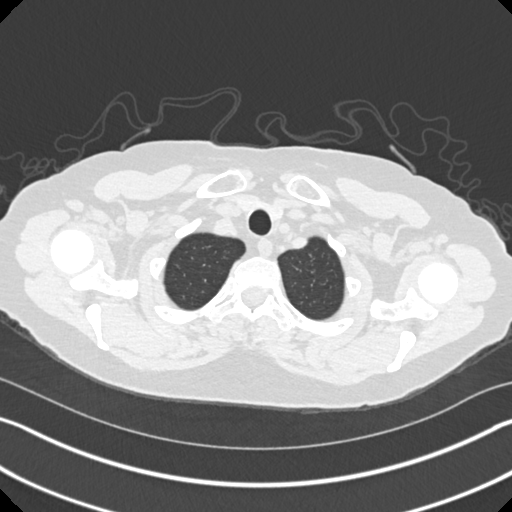
[im 397/426  lung]
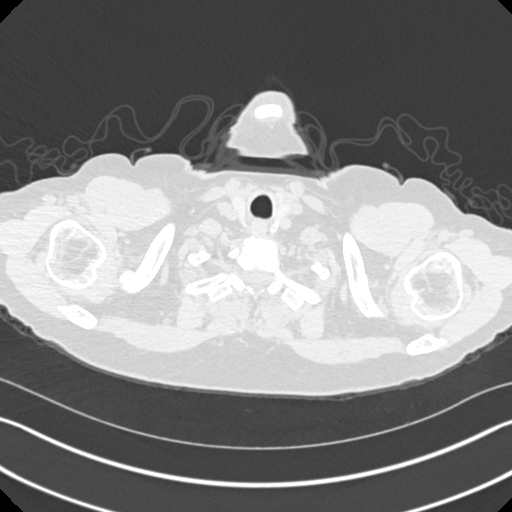

[15 of 36 positions shown; findings below may reference images not displayed]

FINDINGS: Cardiovascular: Calcified atheromatous plaque of the thoracic aorta
is mild. No aortic dilation. Heart size is normal without
substantial pericardial effusion or nodularity. Central pulmonary
vasculature is normal caliber. Limited assessment of cardiovascular
structures given lack of intravenous contrast.

Mediastinum/Nodes: No internal mammary, thoracic inlet or axillary
lymphadenopathy. No mediastinal lymphadenopathy. No gross hilar
lymphadenopathy. Signs of prior granulomatous disease of the RIGHT
hilum with calcified lymph nodes in the chest. Esophagus grossly
normal.

Lungs/Pleura: Granuloma with central calcification in the RIGHT
lower lobe (image 116/8) 13 mm. Inspissated secretions in peripheral
bronchi in the RIGHT lower lobe similar to prior imaging.

Airways are patent.

Tubular structure extending along bronchovascular structures in the
RIGHT upper lobe (image 41/8) compatible with mucous plugging within
a bronchus. The nodule that was present adjacent to this area on the
prior study has resolved compatible with resolving post inflammatory
changes. Airways are patent. Minimal scarring in the lingula.

Upper Abdomen: Incidental imaging of upper abdominal contents
without acute process. No upper abdominal lymphadenopathy.

Musculoskeletal: 13 x 11 mm area of increased density new since very
recent imaging in the RIGHT breast with small locule of gas
adjacent, likely represents the site of biopsy and is associated
with small metallic marker. No acute bone finding. No destructive
bone process. Spinal degenerative changes.
IMPRESSION: 1. Signs of breast biopsy in the RIGHT breast.
2. Findings of prior granulomatous disease with pulmonary granuloma
in the RIGHT lower lobe.
3. Resolution of inflammatory changes about mucoid impaction in a
RIGHT upper lobe bronchus.
4. Aortic atherosclerosis.

Aortic Atherosclerosis ([AD]-[AD]).

## 2021-09-24 ENCOUNTER — Ambulatory Visit: Payer: Medicare Other | Admitting: Thoracic Surgery (Cardiothoracic Vascular Surgery)

## 2021-09-24 ENCOUNTER — Encounter: Payer: Self-pay | Admitting: Thoracic Surgery (Cardiothoracic Vascular Surgery)

## 2021-09-24 ENCOUNTER — Other Ambulatory Visit: Payer: Self-pay

## 2021-09-24 VITALS — BP 169/79 | HR 80 | Resp 20 | Ht 66.0 in | Wt 163.0 lb

## 2021-09-24 DIAGNOSIS — R911 Solitary pulmonary nodule: Secondary | ICD-10-CM

## 2021-09-24 NOTE — Progress Notes (Signed)
[address]       [place] [address]             [phone]      HPI: Ted Leonhart returns for follow-up of a lung nodule  Laila Myhre is a 71 year old woman with history of hypertension, contact dermatitis, seasonal allergies, migraines, and recent diagnosis of early stage breast cancer.  She was found to have a right lower lobe lung nodule on coronary CT neck in June.  That led to a CT of the chest which showed a 1.3 cm calcified nodule in the right lower lobe along with some calcified hilar and mediastinal lymph nodes.  She had a repeat scan in September and saw Erin Barrett.  The nodule was unchanged.  In the interim since we last saw her she has been diagnosed with a early breast cancer.  She is waiting on an appointment with oncology currently.  She has not had any respiratory issues or fevers chills or night sweats.  She has remote history of light tobacco use but quit over 50 years ago.  Past Medical History:  Diagnosis Date   Hypertension     Current Outpatient Medications  Medication Sig Dispense Refill   aspirin EC 81 MG tablet Take 1 tablet (81 mg total) by mouth daily. Swallow whole. 90 tablet 3   atorvastatin (LIPITOR) 40 MG tablet TAKE 1 TABLET BY MOUTH DAILY 90 tablet 1   Coenzyme Q10 (COQ10) 100 MG CAPS Take 1 capsule by mouth daily.     metoprolol succinate (TOPROL-XL) 25 MG 24 hr tablet Take 1 tablet (25 mg total) by mouth daily. 90 tablet 1   NON FORMULARY Take 1 capsule by mouth daily. Vitamin K2 D3     No current facility-administered medications for this visit.    Physical Exam BP (!) 169/79 (BP Location: Right Arm, Patient Position: Sitting, Cuff Size: Normal)    Pulse 80    Resp 20    Ht 5\' 6"  (1.676 m)    Wt 163 lb (73.9 kg)    LMP  (LMP Unknown)    SpO2 97% Comment: RA   BMI 26.20 kg/m  71 year old woman in no acute distress Alert and oriented x3 with no focal deficits Lungs clear bilaterally Cardiac regular rate and  rhythm Cervical supraclavicular adenopathy  Diagnostic Tests: CT CHEST WITHOUT CONTRAST   TECHNIQUE: Multidetector CT imaging of the chest was performed following the standard protocol without IV contrast.   COMPARISON:  June 07, 2021.   FINDINGS: Cardiovascular: Calcified atheromatous plaque of the thoracic aorta is mild. No aortic dilation. Heart size is normal without substantial pericardial effusion or nodularity. Central pulmonary vasculature is normal caliber. Limited assessment of cardiovascular structures given lack of intravenous contrast.   Mediastinum/Nodes: No internal mammary, thoracic inlet or axillary lymphadenopathy. No mediastinal lymphadenopathy. No gross hilar lymphadenopathy. Signs of prior granulomatous disease of the RIGHT hilum with calcified lymph nodes in the chest. Esophagus grossly normal.   Lungs/Pleura: Granuloma with central calcification in the RIGHT lower lobe (image 116/8) 13 mm. Inspissated secretions in peripheral bronchi in the RIGHT lower lobe similar to prior imaging.   Airways are patent.   Tubular structure extending along bronchovascular structures in the RIGHT upper lobe (image 41/8) compatible with mucous plugging within a bronchus. The nodule that was present adjacent to this area on the prior study has resolved compatible with resolving post inflammatory changes. Airways are patent. Minimal scarring in the lingula.  Upper Abdomen: Incidental imaging of upper abdominal contents without acute process. No upper abdominal lymphadenopathy.   Musculoskeletal: 13 x 11 mm area of increased density new since very recent imaging in the RIGHT breast with small locule of gas adjacent, likely represents the site of biopsy and is associated with small metallic marker. No acute bone finding. No destructive bone process. Spinal degenerative changes.   IMPRESSION: 1. Signs of breast biopsy in the RIGHT breast. 2. Findings of prior  granulomatous disease with pulmonary granuloma in the RIGHT lower lobe. 3. Resolution of inflammatory changes about mucoid impaction in a RIGHT upper lobe bronchus. 4. Aortic atherosclerosis.   Aortic Atherosclerosis (ICD10-I70.0).     Electronically Signed   By: Zetta Bills M.D.   On: 09/20/2021 19:20 I personally reviewed the CT images.  There is been no change in the right lower lobe lung nodule.  Some improvement in area around mucoid impaction in right upper lobe.  Impression: Hassie Mandt is a 71 year old woman with history of hypertension, contact dermatitis, seasonal allergies, migraines, and recent diagnosis of early stage breast cancer.    She was found to have a right lower lobe lung nodule on a CT of the chest done for coronary calcium scoring in June 2022.  She then had a follow-up CT 3 months later which showed no change.  On her scan today the nodule again remains unchanged.  There is central calcification.  There are calcified hilar mediastinal lymph nodes.  Findings are likely old granulomatous disease.  To be on the safe side we will plan another CT in about 6 months.  Plan: Return in 6 months with CT chest  Melrose Nakayama, MD [facility] [phone]

## 2021-09-26 ENCOUNTER — Ambulatory Visit: Payer: Self-pay | Admitting: General Surgery

## 2021-09-26 DIAGNOSIS — Z170 Estrogen receptor positive status [ER+]: Secondary | ICD-10-CM

## 2021-09-26 DIAGNOSIS — C50211 Malignant neoplasm of upper-inner quadrant of right female breast: Secondary | ICD-10-CM

## 2021-09-28 ENCOUNTER — Other Ambulatory Visit: Payer: Self-pay | Admitting: General Surgery

## 2021-09-28 DIAGNOSIS — Z170 Estrogen receptor positive status [ER+]: Secondary | ICD-10-CM

## 2021-09-28 DIAGNOSIS — C50411 Malignant neoplasm of upper-outer quadrant of right female breast: Secondary | ICD-10-CM

## 2021-09-30 ENCOUNTER — Telehealth: Payer: Self-pay | Admitting: Hematology

## 2021-09-30 ENCOUNTER — Other Ambulatory Visit: Payer: Self-pay | Admitting: *Deleted

## 2021-09-30 ENCOUNTER — Other Ambulatory Visit: Payer: Self-pay | Admitting: General Surgery

## 2021-09-30 DIAGNOSIS — C50211 Malignant neoplasm of upper-inner quadrant of right female breast: Secondary | ICD-10-CM

## 2021-09-30 DIAGNOSIS — C50411 Malignant neoplasm of upper-outer quadrant of right female breast: Secondary | ICD-10-CM | POA: Insufficient documentation

## 2021-09-30 DIAGNOSIS — Z170 Estrogen receptor positive status [ER+]: Secondary | ICD-10-CM | POA: Insufficient documentation

## 2021-09-30 NOTE — Telephone Encounter (Signed)
Scheduled appt per 1/9 staff msg from [employer]. Called pt, no answer. Left msg for pt to call back to confirm appt date and time.

## 2021-09-30 NOTE — Progress Notes (Signed)
error 

## 2021-09-30 NOTE — Telephone Encounter (Signed)
Pt called back and confirmed appt with Dr. Burr Medico on 1/19.

## 2021-10-07 ENCOUNTER — Other Ambulatory Visit: Payer: Self-pay

## 2021-10-07 ENCOUNTER — Telehealth: Payer: Self-pay

## 2021-10-07 ENCOUNTER — Ambulatory Visit: Payer: Medicare Other | Attending: General Surgery

## 2021-10-07 ENCOUNTER — Encounter (HOSPITAL_BASED_OUTPATIENT_CLINIC_OR_DEPARTMENT_OTHER): Payer: Self-pay | Admitting: General Surgery

## 2021-10-07 DIAGNOSIS — C50411 Malignant neoplasm of upper-outer quadrant of right female breast: Secondary | ICD-10-CM | POA: Insufficient documentation

## 2021-10-07 DIAGNOSIS — Z170 Estrogen receptor positive status [ER+]: Secondary | ICD-10-CM | POA: Insufficient documentation

## 2021-10-07 DIAGNOSIS — R293 Abnormal posture: Secondary | ICD-10-CM | POA: Diagnosis present

## 2021-10-07 NOTE — Progress Notes (Signed)
Radiation Oncology         [phone] ________________________________  Name: Maria Love        MRN: [id number]  Date of Service: 10/10/2021 DOB: 05/21/1951  YI:RSWNIOE, Joelene Millin, MD  Jovita Kussmaul, MD     REFERRING PHYSICIAN: Autumn Messing III, MD   DIAGNOSIS: The encounter diagnosis was Malignant neoplasm of upper-outer quadrant of right breast in female, estrogen receptor positive ([place]).   HISTORY OF PRESENT ILLNESS: Maria Love is a 71 y.o. female seen at the request of Dr. Marlou Starks for a new diagnosis of right breast cancer.  The patient has screening mammogram in October 2022 which showed a possible mass in the right breast there were some calcifications that were favored to be stable but diagnostic imaging on 09/05/2021 showed a persistent change in the upper outer right breast and more diffuse calcifications.  By ultrasound at 11:00 there was a small hypoechoic mass measuring 6 mm with ill-defined margins.  It was consistent with the mammographic finding in her axilla of the right side was negative for adenopathy.  There were scattered benign-appearing bilateral calcifications as well.  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  She met with Dr. Marlou Starks to discuss treatment recommendations, and he has recommended lumpectomy with sentinel lymph node biopsy.  With the lobular nature he also recommended an MRI of the breast which is scheduled for 10/13/2021.  She is seen to discuss adjuvant radiotherapy.  Of note she also is followed by Dr. Roxan Hockey for a right lower lobe nodule.    PREVIOUS RADIATION THERAPY: No   PAST MEDICAL HISTORY:  Past Medical History:  Diagnosis Date   Hypertension        PAST SURGICAL HISTORY: Past Surgical History:  Procedure Laterality Date   FOOT SURGERY Left 2012     FAMILY HISTORY:  Family History  Problem  Relation Age of Onset   Arthritis Mother    Hypertension Mother    Hyperlipidemia Mother    Mitral valve prolapse Mother    Pancreatic cancer Father    Arthritis Sister    Hypertension Sister    Arthritis Maternal Grandmother    Hypertension Maternal Grandmother    Diabetes Maternal Grandfather    Arthritis Paternal Grandmother    Macular degeneration Paternal Grandmother    Thyroid cancer Son    Breast cancer Neg Hx      SOCIAL HISTORY:  reports that she quit smoking about 48 years ago. Her smoking use included cigarettes. She has a 0.25 pack-year smoking history. She has never used smokeless tobacco. She reports that she does not drink alcohol and does not use drugs.  The patient is single and lives in [place].  ALLERGIES: Antihistamines, chlorpheniramine-type   MEDICATIONS:  Current Outpatient Medications  Medication Sig Dispense Refill   aspirin EC 81 MG tablet Take 1 tablet (81 mg total) by mouth daily. Swallow whole. 90 tablet 3   atorvastatin (LIPITOR) 40 MG tablet TAKE 1 TABLET BY MOUTH DAILY 90 tablet 1   Coenzyme Q10 (COQ10) 100 MG CAPS Take 1 capsule by mouth daily.     metoprolol succinate (TOPROL-XL) 25 MG 24 hr tablet Take 1 tablet (25 mg total) by mouth daily. 90 tablet 1   NON FORMULARY Take 1 capsule by mouth daily. Vitamin K2 D3     No current facility-administered medications for this  visit.     REVIEW OF SYSTEMS: On review of systems, the patient reports that she is doing well without pain in her breast. No other complaints are verbalized.      PHYSICAL EXAM:  Wt Readings from Last 3 Encounters:  09/24/21 163 lb (73.9 kg)  06/18/21 172 lb (78 kg)  05/14/21 172 lb 12.8 oz (78.4 kg)   Temp Readings from Last 3 Encounters:  05/14/21 97.6 F (36.4 C) (Temporal)  03/14/21 98.1 F (36.7 C) (Temporal)  01/31/21 (!) 97.1 F (36.2 C) (Temporal)   BP Readings from Last 3 Encounters:  09/24/21 (!) 169/79  06/18/21 (!) 164/80  05/14/21 134/71    Pulse Readings from Last 3 Encounters:  09/24/21 80  06/18/21 80  05/14/21 80    In general this is a well appearing Caucasian female in no acute distress. She's alert and oriented x4 and appropriate throughout the examination. Cardiopulmonary assessment is negative for acute distress and she exhibits normal effort. Bilateral breast exam is deferred.    ECOG = 0  0 - Asymptomatic (Fully active, able to carry on all predisease activities without restriction)  1 - Symptomatic but completely ambulatory (Restricted in physically strenuous activity but ambulatory and able to carry out work of a light or sedentary nature. For example, light housework, office work)  2 - Symptomatic, <50% in bed during the day (Ambulatory and capable of all self care but unable to carry out any work activities. Up and about more than 50% of waking hours)  3 - Symptomatic, >50% in bed, but not bedbound (Capable of only limited self-care, confined to bed or chair 50% or more of waking hours)  4 - Bedbound (Completely disabled. Cannot carry on any self-care. Totally confined to bed or chair)  5 - Death   Eustace Pen MM, Creech RH, Tormey DC, et al. [phone]). "Toxicity and response criteria of the [facility]". [place] Oncol. 5 (6): 649-55    LABORATORY DATA:  Lab Results  Component Value Date   WBC 7.0 07/28/2017   HGB 13.7 07/28/2017   HCT 42.2 07/28/2017   MCV 85.6 07/28/2017   PLT 258.0 07/28/2017   Lab Results  Component Value Date   NA 139 07/28/2017   K 4.5 07/28/2017   CL 104 07/28/2017   CO2 28 07/28/2017   Lab Results  Component Value Date   ALT 15 07/28/2017   AST 16 07/28/2017   ALKPHOS 101 07/28/2017   BILITOT 0.4 07/28/2017      RADIOGRAPHY: CT Chest Wo Contrast  Result Date: 09/20/2021 CLINICAL DATA:  A 71 year old female presents with history of breast cancer for lung nodule follow-up. New diagnosis of breast cancer on the RIGHT. EXAM: CT CHEST  WITHOUT CONTRAST TECHNIQUE: Multidetector CT imaging of the chest was performed following the standard protocol without IV contrast. COMPARISON:  June 07, 2021. FINDINGS: Cardiovascular: Calcified atheromatous plaque of the thoracic aorta is mild. No aortic dilation. Heart size is normal without substantial pericardial effusion or nodularity. Central pulmonary vasculature is normal caliber. Limited assessment of cardiovascular structures given lack of intravenous contrast. Mediastinum/Nodes: No internal mammary, thoracic inlet or axillary lymphadenopathy. No mediastinal lymphadenopathy. No gross hilar lymphadenopathy. Signs of prior granulomatous disease of the RIGHT hilum with calcified lymph nodes in the chest. Esophagus grossly normal. Lungs/Pleura: Granuloma with central calcification in the RIGHT lower lobe (image 116/8) 13 mm. Inspissated secretions in peripheral bronchi in the RIGHT lower lobe similar to prior imaging. Airways are patent.  Tubular structure extending along bronchovascular structures in the RIGHT upper lobe (image 41/8) compatible with mucous plugging within a bronchus. The nodule that was present adjacent to this area on the prior study has resolved compatible with resolving post inflammatory changes. Airways are patent. Minimal scarring in the lingula. Upper Abdomen: Incidental imaging of upper abdominal contents without acute process. No upper abdominal lymphadenopathy. Musculoskeletal: 13 x 11 mm area of increased density new since very recent imaging in the RIGHT breast with small locule of gas adjacent, likely represents the site of biopsy and is associated with small metallic marker. No acute bone finding. No destructive bone process. Spinal degenerative changes. IMPRESSION: 1. Signs of breast biopsy in the RIGHT breast. 2. Findings of prior granulomatous disease with pulmonary granuloma in the RIGHT lower lobe. 3. Resolution of inflammatory changes about mucoid impaction in a RIGHT  upper lobe bronchus. 4. Aortic atherosclerosis. Aortic Atherosclerosis (ICD10-I70.0). Electronically Signed   By: Zetta Bills M.D.   On: 09/20/2021 19:20   DG BONE DENSITY (DXA)  Result Date: 09/20/2021 EXAM: DUAL X-RAY ABSORPTIOMETRY (DXA) FOR BONE MINERAL DENSITY IMPRESSION: Referring Physician:  Willey Blade Your patient completed a bone mineral density test using GE Lunar iDXA system (analysis version: 16). Technologist: [place] PATIENT: Name: Maria, Love Patient ID: [id number] Birth Date: 01/02/1951 Height: 65.5 in. Sex: Female Measured: 09/20/2021 Weight: 167.6 lbs. Indications: Caucasian, Estrogen Deficient, Family Hist. (Parent hip fracture), History of Fracture (Adult) (V15.51), Postmenopausal Fractures: Left Foot, Right Hand Treatments: Vitamin D (E933.5) ASSESSMENT: The BMD measured at Femur Total Right is 0.791 g/cm2 with a T-score of -1.7. This patient is considered osteopenic/low bone mass according to [facility]) criteria. The quality of the exam is good. L3, L4 was excluded due to degenerative changes. Site Region Measured Date Measured Age YA BMD Significant CHANGE T-score AP Spine  L1-L2       10/20/[age]    69.3         -1.6    0.973 g/cm2 DualFemur Total Right 09/20/2022    69.3         -1.7    0.791 g/cm2 DualFemur Total Mean  09[age]20/2021    69.3         -1.7    0.795 g/cm2 World Health Organization [facility]) criteria for post-menopausal, Caucasian Women: Normal       T-score at or above -1 SD Osteopenia   T-score between -1 and -2.5 SD Osteoporosis T-score at or below -2.5 SD RECOMMENDATION: 1. All patients should optimize calcium and vitamin D intake. 2. Consider FDA-approved medical therapies in postmenopausal women and men aged 10 years and older, based on the following: a. A hip or vertebral (clinical or morphometric) fracture. b. T-score = -2.5 at the femoral neck or spine after appropriate evaluation to exclude secondary causes. c. Low bone mass (T-score  between -1.0 and -2.5 at the femoral neck or spine) and a 10-year probability of a hip fracture = 3% or a 10-year probability of a major osteoporosis-related fracture = 20% based on the [place]-adapted WHO algorithm. d. Clinician judgment and/or patient preferences may indicate treatment for people with 10-year fracture probabilities above or below these levels. FOLLOW-UP: Patients with diagnosis of osteoporosis or at high risk for fracture should have regular bone mineral density tests.? Patients eligible for Medicare are allowed routine testing every 2 years.? The testing frequency can be increased to one year for patients who have rapidly progressing disease, are receiving or discontinuing medical therapy to restore  bone mass, or have additional risk factors. I have reviewed this study and agree with the findings. [facility] FRAX* 10-year Probability of Fracture Based on femoral neck BMD: DualFemur (Right) Major Osteoporotic Fracture: 25.3% Hip Fracture:                4.4% Population:                  [place] (Caucasian) Risk Factors: Family Hist. (Parent hip fracture), History of Fracture (Adult) (V15.51) *FRAX is a Materials engineer of the [employer] for Metabolic Bone Disease, a World Pharmacologist (WHO) [facility]. ASSESSMENT: The probability of a major osteoporotic fracture is 25.3% within the next ten years. The probability of a hip fracture is 4.4% within the next ten years. Electronically Signed   By: Lajean Manes M.D.   On: 09/20/2021 16:04   MM CLIP PLACEMENT RIGHT  Addendum Date: 09/17/2021   ADDENDUM REPORT: 09/17/2021 13:28 ADDENDUM: This was a right mammogram, not a left. Electronically Signed   By: Dorise Bullion III M.D.   On: 09/17/2021 13:28   Result Date: 09/17/2021 CLINICAL DATA:  Evaluate biopsy marker EXAM: 3D DIAGNOSTIC LEFT MAMMOGRAM POST ULTRASOUND BIOPSY COMPARISON:  Previous exam(s). FINDINGS: 3D Mammographic images were  obtained following ultrasound guided biopsy of a right breast mass. The biopsy marking clip is along the superolateral aspect of the biopsied mass. IMPRESSION: Appropriate positioning of the ribbon shaped biopsy marking clip at along the superolateral aspect of the biopsied right breast mass. Final Assessment: Post Procedure Mammograms for Marker Placement Electronically Signed: By: Dorise Bullion III M.D. On: 09/17/2021 10:58  [place] RT BREAST BX W LOC DEV 1ST LESION IMG BX SPEC US GUIDE  Addendum Date: 09/19/2021   ADDENDUM REPORT: 09/19/2021 08:15 ADDENDUM: Pathology revealed GRADE II INVASIVE MAMMARY CARCINOMA of the RIGHT breast, 11 o'clock, (ribbon clip). This was found to be concordant by Dr. Dorise Bullion. Pathology results were discussed with the patient by telephone. The patient reported doing well after the biopsy with tenderness and bruising at the site. Post biopsy instructions and care were reviewed and questions were answered. The patient was encouraged to call [facility] for any additional concerns. My direct phone number was provided. Surgical consultation has been arranged with Dr. Autumn Messing at [facility] on September 26, 2021. Pathology results reported by Terie Purser, RN on 09/18/2021. Electronically Signed   By: Dorise Bullion III M.D.   On: 09/19/2021 08:15   Result Date: 09/19/2021 CLINICAL DATA:  Biopsy of an 11 o'clock right breast mass EXAM: ULTRASOUND GUIDED RIGHT BREAST CORE NEEDLE BIOPSY COMPARISON:  Previous exam(s). PROCEDURE: I met with the patient and we discussed the procedure of ultrasound-guided biopsy, including benefits and alternatives. We discussed the high likelihood of a successful procedure. We discussed the risks of the procedure, including infection, bleeding, tissue injury, clip migration, and inadequate sampling. Informed written consent was given. The usual time-out protocol was performed immediately prior to the  procedure. Lesion quadrant: 11 o'clock right breast Using sterile technique and 1% Lidocaine as local anesthetic, under direct ultrasound visualization, a 12 gauge spring-loaded device was used to perform biopsy of an 11 o'clock right breast mass using a lateral approach. At the conclusion of the procedure a ribbon shaped tissue marker clip was deployed into the biopsy cavity. Follow up 2 view mammogram was performed and dictated separately. IMPRESSION: Ultrasound guided biopsy of an 11 o'clock right breast mass.  No apparent complications. Electronically Signed: By: Dorise Bullion III M.D. On: 09/17/2021 10:49      IMPRESSION/PLAN: 1. 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 Dr. Lisbeth Renshaw discusses the pathology findings and reviews the nature of early stage right breast disease. She is going to undergo an MRI of the breast to make sure she's a good candidate for breast conservation with lumpectomy with sentinel node biopsy. Depending on the size of the final tumor measurements rendered by pathology, the tumor may be tested for Oncotype Dx score to determine a role for systemic therapy. Provided that chemotherapy is not indicated, the patient's course would then be followed by external radiotherapy to the breast  to reduce risks of local recurrence followed by antiestrogen therapy. We discussed the risks, benefits, short, and long term effects of radiotherapy, as well as the curative intent, and the patient is interested in proceeding. Dr. Lisbeth Renshaw discusses the delivery and logistics of radiotherapy and anticipates a course of 4 or up to 6 1/2 weeks of radiotherapy to the right breast. We will see her back a few weeks after surgery to discuss the simulation process and anticipate we starting radiotherapy about 4-6 weeks after surgery.  2. RLL Nodule. She will follow up with Dr. Roxan Hockey for surveillance of this.  In a visit lasting 60 minutes, greater than 50%  of the time was spent face to face reviewing her case, as well as in preparation of, discussing, and coordinating the patient's care.  The above documentation reflects my direct findings during this shared patient visit. Please see the separate note by Dr. Lisbeth Renshaw on this date for the remainder of the patient's plan of care.    Carola Rhine, [facility]    **Disclaimer: This note was dictated with voice recognition software. Similar sounding words can inadvertently be transcribed and this note may contain transcription errors which may not have been corrected upon publication of note.**

## 2021-10-07 NOTE — Telephone Encounter (Signed)
Hold for now and restart aftr surgery

## 2021-10-07 NOTE — Therapy (Signed)
[facility] @ [address] [place], [place], [address] Phone: [phone]   Fax:  [phone]  Physical Therapy Evaluation  Patient Details  Name: Maria Love MRN: [id number] Date of Birth: 07-14-1951 Referring Provider (PT): Dr. Marlou Starks   Encounter Date: 10/07/2021   PT End of Session - 10/07/21 0957     Visit Number 1    Number of Visits 2    Date for PT Re-Evaluation 11/18/21    PT Start Time 0905    PT Stop Time 0948    PT Time Calculation (min) 43 min    Activity Tolerance Patient tolerated treatment well    Behavior During Therapy [facility] for tasks assessed/performed             Past Medical History:  Diagnosis Date   Hypertension     Past Surgical History:  Procedure Laterality Date   FOOT SURGERY Left 2012    There were no vitals filed for this visit.    Subjective Assessment - 10/07/21 0907     Subjective Pt is here for pre-surgical screen. She is pending surgery on 10/16/2021 for Right breast lumpectomy with SLNB    Patient is accompained by: --    Pertinent History The patient appears to have a small stage I cancer in the upper outer quadrant of the right breast..  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  She is pending right breast lumpectomy with SLNB on 10/16/2021.She has a hx of right shoulder disclocation many years ago.    Patient Stated Goals Get baselines and gain information from providers    Currently in Pain? No/denies    Multiple Pain Sites No                [facility] PT Assessment - 10/07/21 0001       Assessment   Medical Diagnosis Right breast Cancer    Referring Provider (PT) Dr. Marlou Starks    Onset Date/Surgical Date 10/16/21    Hand Dominance Right    Prior Therapy yes      Precautions   Precaution Comments will be lymphedema risk      Restrictions   Weight Bearing Restrictions No      Balance Screen   Has the patient  fallen in the past 6 months No    Has the patient had a decrease in activity level because of a fear of falling?  No    Is the patient reluctant to leave their home because of a fear of falling?  No      Home Environment   Living Environment Private residence    Living Arrangements Non-relatives/Friends    Available Help at Discharge Friend(s)    Type of Home Apartment      Prior Function   Level of Independence Independent      Cognition   Overall Cognitive Status Within Functional Limits for tasks assessed      Posture/Postural Control   Posture/Postural Control Postural limitations    Postural Limitations Rounded Shoulders;Forward head      AROM   Right Shoulder Extension 66 Degrees    Right Shoulder Flexion 147 Degrees    Right Shoulder ABduction 175 Degrees    Right Shoulder Internal Rotation 63 Degrees    Right Shoulder External Rotation 118 Degrees    Left Shoulder Extension 68 Degrees    Left Shoulder Flexion 160 Degrees    Left  Shoulder ABduction 170 Degrees    Left Shoulder Internal Rotation 68 Degrees    Left Shoulder External Rotation 105 Degrees               LYMPHEDEMA/ONCOLOGY QUESTIONNAIRE - 10/07/21 0001       Type   Cancer Type Right breast CA      Surgeries   Lumpectomy Date 10/16/21    Sentinel Lymph Node Biopsy Date 10/16/21      Treatment   Active Chemotherapy Treatment No    Past Chemotherapy Treatment No    Active Radiation Treatment No    Past Radiation Treatment No    Current Hormone Treatment No    Past Hormone Therapy No      What other symptoms do you have   Are you Having Heaviness or Tightness No    Are you having Pain No    Are you having pitting edema No    Is it Hard or Difficult finding clothes that fit No      Right Upper Extremity Lymphedema   15 cm Proximal to Olecranon Process 30.6 cm    10 cm Proximal to Olecranon Process 30.3 cm    Olecranon Process 26.8 cm    15 cm Proximal to Ulnar Styloid Process 25.4 cm     10 cm Proximal to Ulnar Styloid Process 22.6 cm    Just Proximal to Ulnar Styloid Process 17.2 cm    At Base of 2nd Digit 6 cm      Left Upper Extremity Lymphedema   15 cm Proximal to Olecranon Process 30.5 cm    10 cm Proximal to Olecranon Process 30.3 cm    Olecranon Process 26.8 cm    15 cm Proximal to Ulnar Styloid Process 25.5 cm    10 cm Proximal to Ulnar Styloid Process 22.6 cm    Just Proximal to Ulnar Styloid Process 16.9 cm    At Base of 2nd Digit 6 cm             L-DEX FLOWSHEETS - 10/07/21 0900       L-DEX LYMPHEDEMA SCREENING   Measurement Type Unilateral    L-DEX MEASUREMENT EXTREMITY Upper Extremity    POSITION  Standing    DOMINANT SIDE Right    At Risk Side Right    BASELINE SCORE (UNILATERAL) 1.3                  Quick Dash - 10/07/21 0001     Open a tight or new jar Mild difficulty    Do heavy household chores (wash walls, wash floors) No difficulty    Carry a shopping bag or briefcase No difficulty    Wash your back No difficulty    Use a knife to cut food No difficulty    Recreational activities in which you take some force or impact through your arm, shoulder, or hand (golf, hammering, tennis) No difficulty    During the past week, to what extent has your arm, shoulder or hand problem interfered with your normal social activities with family, friends, neighbors, or groups? Not at all    During the past week, to what extent has your arm, shoulder or hand problem limited your work or other regular daily activities Not at all    Arm, shoulder, or hand pain. Mild    Tingling (pins and needles) in your arm, shoulder, or hand None    Difficulty Sleeping No difficulty    DASH Score 4.55 %  Objective measurements completed on examination: See above findings.                PT Education - 10/07/21 0956     Education Details ABC class, SOZO every 3 months, lymphedema, 4 post op exercises    Person(s) Educated  Patient    Comprehension Verbalized understanding                 PT Long Term Goals - 10/07/21 1002       PT LONG TERM GOAL #1   Title Pt will restore Right shoulder AROM to within 5-10 degrees of baseline for return to normal daily activities    Time 6    Period Weeks    Status New    Target Date 11/18/21             Breast Clinic Goals - 10/07/21 1001       Patient will be able to verbalize understanding of pertinent lymphedema risk reduction practices relevant to her diagnosis specifically related to skin care.   Time 1    Period Days    Status Achieved    Target Date 10/07/21      Patient will be able to return demonstrate and/or verbalize understanding of the post-op home exercise program related to regaining shoulder range of motion.   Time 1    Period Days    Status Achieved    Target Date 10/07/21      Patient will be able to verbalize understanding of the importance of attending the postoperative After Breast Cancer Class for further lymphedema risk reduction education and therapeutic exercise.   Time 1    Period Days    Status Achieved    Target Date 10/07/21                   Plan - 10/07/21 6606     Clinical Impression Statement Pt is here for pre-surgical screening.  Baselines were performed for shoulder ROM, circumference measures, SOZO baseline.  She was instructed in 4 post op exercises to take to comfortable stretch only , educated in importance of attending the ABC class, and in SOZO screens to be performed every 3 months post surgery for 2 years.  She was also educated in lymphedema and warning signs for infection/skin care.    Personal Factors and Comorbidities Comorbidity 1;Comorbidity 2    Comorbidities Right breast Cancer with likely radiation, prior right shoulder dislocation years ago    Stability/Clinical Decision Making Stable/Uncomplicated    Clinical Decision Making Low    Rehab Potential Excellent    PT Frequency --   1  more visit   PT Duration 6 weeks    PT Treatment/Interventions ADLs/Self Care Home Management;Therapeutic exercise;Patient/family education    PT Next Visit Plan Reassess after surgery    Recommended Other Services ABC class and next SOZO and post op all set up    Consulted and Agree with Plan of Care Patient            Patient will follow up at outpatient cancer rehab 3-4 weeks following surgery.  If the patient requires physical therapy at that time, a specific plan will be dictated and sent to the referring physician for approval. The patient was educated today on appropriate basic range of motion exercises to begin post operatively and the importance of attending the After Breast Cancer class following surgery.  Patient was educated today on lymphedema risk reduction practices as it pertains to  recommendations that will benefit the patient immediately following surgery.  She verbalized good understanding.   The patient was assessed using the L-Dex machine today to produce a lymphedema index baseline score. The patient will be reassessed on a regular basis (typically every 3 months) to obtain new L-Dex scores. If the score is > 6.5 points away from his/her baseline score indicating onset of subclinical lymphedema, it will be recommended to wear a compression garment for 4 weeks, 12 hours per day and then be reassessed. If the score continues to be > 6.5 points from baseline at reassessment, we will initiate lymphedema treatment. Assessing in this manner has a 95% rate of preventing clinically significant lymphedema.  Patient will benefit from skilled therapeutic intervention in order to improve the following deficits and impairments:  Postural dysfunction, Decreased knowledge of precautions  Visit Diagnosis: Malignant neoplasm of upper-outer quadrant of right breast in female, estrogen receptor positive ([place])  Abnormal posture     Problem List Patient Active Problem List   Diagnosis  Date Noted   Malignant neoplasm of upper-outer quadrant of right breast in female, estrogen receptor positive ([place]) 09/30/2021   Benign hypertension 07/28/2017   Cellulitis of face 07/28/2017   Contact dermatitis 07/28/2017   Foot pain 07/28/2017   Seasonal allergies 07/28/2017   Hx of migraines 07/28/2017    Claris Pong, PT 10/07/2021, 4:57 PM  [facility] @ [address] [place], [place], [address] Phone: [phone]   Fax:  [phone]  Name: Maria Love MRN: [id number] Date of Birth: Jun 14, 1951

## 2021-10-07 NOTE — Patient Instructions (Signed)
Physical Therapy Information for After Breast Cancer Surgery/Treatment:  Lymphedema is a swelling condition that you may be at risk for in your arm if you have lymph nodes removed from the armpit area.  After a sentinel node biopsy, the risk is approximately 5-9% and is higher after an axillary node dissection.  There is treatment available for this condition and it is not life-threatening.  Contact your physician or physical therapist with concerns. You may begin the 4 shoulder/posture exercises (see additional sheet) when permitted by your physician (typically a week after surgery).  If you have drains, you may need to wait until those are removed before beginning range of motion exercises.  A general recommendation is to not lift your arms above shoulder height until drains are removed.  These exercises should be done to your tolerance and gently.  This is not a "no pain/no gain" type of recovery so listen to your body and stretch into the range of motion that you can tolerate, stopping if you have pain.  If you are having immediate reconstruction, ask your plastic surgeon about doing exercises as he or she may want you to wait. We encourage you to attend the free one time ABC (After Breast Cancer) class offered by [facility].  You will learn information related to lymphedema risk, prevention and treatment and additional exercises to regain mobility following surgery.  You can call [phone] for more information.  This is offered the 1st and 3rd Monday of each month.  You only attend the class one time. While undergoing any medical procedure or treatment, try to avoid blood pressure being taken or needle sticks from occurring on the arm on the side of cancer.   This recommendation begins after surgery and continues for the rest of your life.  This may help reduce your risk of getting lymphedema (swelling in your arm). An excellent resource for those seeking information on  lymphedema is the National Lymphedema Network's web site. It can be accessed at [place].org If you notice swelling in your hand, arm or breast at any time following surgery (even if it is many years from now), please contact your doctor or physical therapist to discuss this.  Lymphedema can be treated at any time but it is easier for you if it is treated early on.  If you feel like your shoulder motion is not returning to normal in a reasonable amount of time, please contact your surgeon or physical therapist.  [facility] [place], [place], [place] [phone]; [address], [place], [place] [address] ABC CLASS After Breast Cancer Class  After Breast Cancer Class is a specially designed exercise class to assist you in a safe recover after having breast cancer surgery.  In this class you will learn how to get back to full function whether your drains were just removed or if you had surgery a month ago.  This one-time class is held the 1st and 3rd Monday of every month from 11:00 a.m. until 12:00 noon at the [facility] located at [place], [place] [address]  This class is FREE and space is limited. For more information or to register for the next available class, call [phone].  Class Goals  Understand specific stretches to improve the flexibility of you chest and shoulder. Learn ways to safely strengthen your upper body and improve your posture. Understand the warning signs of infection and why you may be at risk for an arm infection.  Learn about Lymphedema and prevention.  ** You do not attend this class until after surgery.  Drains must be removed to participate  Patient was instructed today in a home exercise program today for post op shoulder range of motion. These included active assist shoulder flexion in sitting/supine, scapular retraction, wall walking with shoulder abduction, and hands behind head external rotation/supine  She was  encouraged to do these twice a day, holding 3 seconds and repeating 5 times when permitted by her physician.

## 2021-10-08 NOTE — Telephone Encounter (Signed)
Patient is aware 

## 2021-10-09 LAB — COLOGUARD: COLOGUARD: NEGATIVE

## 2021-10-09 NOTE — Progress Notes (Signed)
New Breast Cancer Diagnosis: Right Breast UOQ  Did patient present with symptoms (if so, please note symptoms) or screening mammography?:Screening Mass and calcifications,   MRI Breast 10/13/2021:  Location and Extent of disease :right breast. Located at 11 o'clock position, measured  0.4 cm in greatest dimension. Adenopathy no.   Histology per Pathology Report: grade 2, Invasive Lobular Carcinoma  Receptor Status: ER(positive), PR (positive), Her2-neu (negative), Ki-(15%)   Surgeon and surgical plan, if any:  Dr. Marlou Starks -Right Breast Lumpectomy with radioactive seed and SLN biopsy 10/16/2021   Medical oncologist, treatment if any:   Dr. Burr Medico 10/10/2021   Family History of Breast/Ovarian/Prostate Cancer: None  Lymphedema issues, if any: None     Pain issues, if any: No    SAFETY ISSUES: Prior radiation? No Pacemaker/ICD? No Possible current pregnancy? Postmenopausal Is the patient on methotrexate? No  Current Complaints / other details:

## 2021-10-10 ENCOUNTER — Ambulatory Visit
Admission: RE | Admit: 2021-10-10 | Discharge: 2021-10-10 | Disposition: A | Discharge status: Home / Self Care | Payer: Medicare Other | Source: Ambulatory Visit | Attending: Radiation Oncology | Admitting: Radiation Oncology

## 2021-10-10 ENCOUNTER — Inpatient Hospital Stay: Payer: Medicare Other

## 2021-10-10 ENCOUNTER — Encounter: Payer: Self-pay | Admitting: Radiation Oncology

## 2021-10-10 ENCOUNTER — Encounter: Payer: Self-pay | Admitting: Hematology

## 2021-10-10 ENCOUNTER — Other Ambulatory Visit: Payer: Self-pay

## 2021-10-10 ENCOUNTER — Inpatient Hospital Stay: Payer: Medicare Other | Attending: Hematology | Admitting: Hematology

## 2021-10-10 VITALS — BP 144/73 | HR 70 | Temp 97.8°F | Resp 16 | Ht 66.0 in | Wt 167.8 lb

## 2021-10-10 VITALS — BP 145/66 | HR 73 | Temp 97.5°F | Resp 20 | Ht 66.0 in | Wt 168.8 lb

## 2021-10-10 DIAGNOSIS — Z170 Estrogen receptor positive status [ER+]: Secondary | ICD-10-CM | POA: Diagnosis not present

## 2021-10-10 DIAGNOSIS — C50411 Malignant neoplasm of upper-outer quadrant of right female breast: Secondary | ICD-10-CM | POA: Diagnosis present

## 2021-10-10 DIAGNOSIS — Z808 Family history of malignant neoplasm of other organs or systems: Secondary | ICD-10-CM | POA: Insufficient documentation

## 2021-10-10 DIAGNOSIS — J8410 Pulmonary fibrosis, unspecified: Secondary | ICD-10-CM | POA: Insufficient documentation

## 2021-10-10 DIAGNOSIS — Z79899 Other long term (current) drug therapy: Secondary | ICD-10-CM | POA: Diagnosis not present

## 2021-10-10 DIAGNOSIS — R911 Solitary pulmonary nodule: Secondary | ICD-10-CM | POA: Insufficient documentation

## 2021-10-10 DIAGNOSIS — Z7982 Long term (current) use of aspirin: Secondary | ICD-10-CM | POA: Diagnosis not present

## 2021-10-10 DIAGNOSIS — I700 Atherosclerosis of aorta: Secondary | ICD-10-CM | POA: Diagnosis not present

## 2021-10-10 DIAGNOSIS — I10 Essential (primary) hypertension: Secondary | ICD-10-CM | POA: Insufficient documentation

## 2021-10-10 DIAGNOSIS — Z85828 Personal history of other malignant neoplasm of skin: Secondary | ICD-10-CM | POA: Insufficient documentation

## 2021-10-10 DIAGNOSIS — M810 Age-related osteoporosis without current pathological fracture: Secondary | ICD-10-CM | POA: Diagnosis not present

## 2021-10-10 DIAGNOSIS — Z87891 Personal history of nicotine dependence: Secondary | ICD-10-CM | POA: Insufficient documentation

## 2021-10-10 DIAGNOSIS — M8580 Other specified disorders of bone density and structure, unspecified site: Secondary | ICD-10-CM | POA: Diagnosis not present

## 2021-10-10 LAB — CBC WITH DIFFERENTIAL/PLATELET
Abs Immature Granulocytes: 0.02 10*3/uL (ref 0.00–0.07)
Basophils Absolute: 0.1 10*3/uL (ref 0.0–0.1)
Basophils Relative: 1 %
Eosinophils Absolute: 0.1 10*3/uL (ref 0.0–0.5)
Eosinophils Relative: 2 %
HCT: 45.9 % (ref 36.0–46.0)
Hemoglobin: 15.3 g/dL — ABNORMAL HIGH (ref 12.0–15.0)
Immature Granulocytes: 0 %
Lymphocytes Relative: 21 %
Lymphs Abs: 1.9 10*3/uL (ref 0.7–4.0)
MCH: 30.1 pg (ref 26.0–34.0)
MCHC: 33.3 g/dL (ref 30.0–36.0)
MCV: 90.4 fL (ref 80.0–100.0)
Monocytes Absolute: 0.8 10*3/uL (ref 0.1–1.0)
Monocytes Relative: 9 %
Neutro Abs: 6 10*3/uL (ref 1.7–7.7)
Neutrophils Relative %: 67 %
Platelets: 253 10*3/uL (ref 150–400)
RBC: 5.08 MIL/uL (ref 3.87–5.11)
RDW: 12.6 % (ref 11.5–15.5)
WBC: 9.1 10*3/uL (ref 4.0–10.5)
nRBC: 0 % (ref 0.0–0.2)

## 2021-10-10 LAB — COMPREHENSIVE METABOLIC PANEL
ALT: 46 U/L — ABNORMAL HIGH (ref 0–44)
AST: 28 U/L (ref 15–41)
Albumin: 4.3 g/dL (ref 3.5–5.0)
Alkaline Phosphatase: 142 U/L — ABNORMAL HIGH (ref 38–126)
Anion gap: 5 (ref 5–15)
BUN: 21 mg/dL (ref 8–23)
CO2: 30 mmol/L (ref 22–32)
Calcium: 11.1 mg/dL — ABNORMAL HIGH (ref 8.9–10.3)
Chloride: 107 mmol/L (ref 98–111)
Creatinine, Ser: 0.91 mg/dL (ref 0.44–1.00)
GFR, Estimated: >60 mL/min (ref >60)
Glucose, Bld: 102 mg/dL — ABNORMAL HIGH (ref 70–99)
Potassium: 4.5 mmol/L (ref 3.5–5.1)
Sodium: 142 mmol/L (ref 135–145)
Total Bilirubin: 0.5 mg/dL (ref 0.3–1.2)
Total Protein: 7.6 g/dL (ref 6.5–8.1)

## 2021-10-10 NOTE — Addendum Note (Signed)
Encounter addended by: Cori Razor, RN on: 10/10/2021 2:25 PM  Actions taken: Flowsheet accepted

## 2021-10-10 NOTE — Progress Notes (Signed)
[place]   [phone]) [phone] Fax:([phone]) [place] Note   Patient Care Team: Willey Blade, MD as PCP - General (Internal Medicine) Adrian Prows, MD as Consulting Physician (Cardiology) Kyung Rudd, MD as Consulting Physician (Radiation Oncology) Truitt Merle, MD as Consulting Physician (Hematology) Jovita Kussmaul, MD as Consulting Physician (General Surgery) Melrose Nakayama, MD as Consulting Physician (Cardiothoracic Surgery)  Date of Service:  10/10/2021   CHIEF COMPLAINTS/PURPOSE OF CONSULTATION:  Right Breast Cancer, ER+  REFERRING PHYSICIAN:  Dr. Marlou Love   ASSESSMENT & PLAN:  Naleyah Love is a 71 y.o. postmenopausal female with a history of HTN  1. Malignant neoplasm of upper-outer quadrant of right breast, Stage IA, c(T1b, N0), ER+/PR+/HER2-, Grade 2  -found on screening mammogram. Right diagnostic MM and [place] on 09/05/21 showed 6 mm mass at 11 o'clock. 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 --We discussed her imaging findings and the biopsy results in great details. -She is scheduled for right lumpectomy on 10/16/21 with Dr. Marlou Love. -She was also seen by radiation oncologist Dr. Lisbeth Love today. She will likely benefit from breast radiation if she undergo lumpectomy to decrease the risk of breast cancer.  -I discussed the role of Oncotype on her surgical sample to predict her risk of future recurrence and benefit of adjuvant chemo  -Giving the strong ER and PR expression in her postmenopausal status, I recommend adjuvant endocrine therapy with aromatase inhibitor for a total of 5-10 years to reduce the risk of cancer recurrence. Potential benefits and side effects were discussed with patient and she is interested. -We also discussed the breast cancer surveillance after her surgery. She will continue annual screening mammogram, self exam, and a routine office visit with lab and exam with [place]. -I encouraged her to have  healthy diet and exercise regularly.   2. Osteopenia -Her most recent DEXA was 09/20/21 showing osteopenia (T-score -1.7)   PLAN:  -lab today for baseline -lumpectomy 10/16/21 -Oncotype on surgical sample if tumor>1cm  -radiation therapy -I will see her back near the end of radiation to review her antiestrogen therapy plan   Oncology History Overview Note   Cancer Staging  Malignant neoplasm of upper-outer quadrant of right breast in female, estrogen receptor positive ([place]) Staging form: Breast, AJCC 8th Edition - Clinical stage from 10/07/2021: Stage IA (cT1b, cN0, cM0, G2, ER+, PR+, HER2-) - Signed by Truitt Merle, MD on 10/10/2021 Method of lymph node assessment: Clinical Histologic grading system: 3 grade system     Malignant neoplasm of upper-outer quadrant of right breast in female, estrogen receptor positive ([place])  09/05/2021 Mammogram   CLINICAL DATA:  Screening recall for a possible right breast mass as well as right breast calcifications and possible asymmetry.   EXAM: DIGITAL DIAGNOSTIC UNILATERAL RIGHT MAMMOGRAM WITH TOMOSYNTHESIS AND CAD; ULTRASOUND RIGHT BREAST LIMITED  IMPRESSION: 1. Highly suspicious 6 mm mass in the right breast at 11 o'clock. Tissue sampling is recommended. No evidence of right axillary metastatic lymphadenopathy. 2. Scattered benign bilateral breast calcifications. No suspicious calcifications.   09/17/2021 Initial Biopsy   Diagnosis Breast, right, needle core biopsy, 11 o'clock - INVASIVE MAMMARY CARCINOMA, GRADE 2, RANGING TO 0.4 CM IN MAXIMUM EXTENT. - THREE OF THREE BIOPSY FRAGMENTS INVOLVED.  ADDENDUM: Negative E-Cadherin immunostaining within the tumor supports a lobular phenotype  PROGNOSTIC INDICATORS Results: The tumor cells are NEGATIVE for Her2 (1+). Estrogen Receptor: 95%, POSITIVE, STRONG STAINING INTENSITY Progesterone Receptor: 100%, POSITIVE, STRONG STAINING INTENSITY Proliferation Marker Ki67:  15%   09/30/2021 Initial  Diagnosis   Malignant neoplasm of upper-outer quadrant of right breast in female, estrogen receptor positive ([place])   10/07/2021 Cancer Staging   Staging form: Breast, AJCC 8th Edition - Clinical stage from 10/07/2021: Stage IA (cT1b, cN0, cM0, G2, ER+, PR+, HER2-) - Signed by Truitt Merle, MD on 10/10/2021 Method of lymph node assessment: Clinical Histologic grading system: 3 grade system       HISTORY OF PRESENTING ILLNESS:  Maria Love 71 y.o. female is a here because of breast cancer. The patient was referred by Dr. Marlou Love. The patient presents to the clinic today alone.   She had routine screening mammography on 07/12/21 showing a possible abnormality in the right breast. She underwent right diagnostic mammography and right breast ultrasonography on 09/05/21 showing: 0.6 cm mass in right breast at 11 o'clock; no evidence of lymphadenopathy.  Biopsy on 09/17/21 showed: invasive lobular carcinoma, grade 2. Prognostic indicators significant for: estrogen receptor, 95% positive and progesterone receptor, 100% positive. Proliferation marker Ki67 at 15%. HER2 negative.   Today the patient notes they felt/feeling prior/after... -she denies feeling the breast lump herself -she denies any pain  She has a PMHx of.... -HTN  Socially... -she is a retired Pharmacist, hospital -she has one son, who lives in [place] -she lives with a roommate (and her dog).   REVIEW OF SYSTEMS:    Constitutional: Denies fevers, chills or abnormal night sweats Eyes: Denies blurriness of vision, double vision or watery eyes Ears, nose, mouth, throat, and face: Denies mucositis or sore throat Respiratory: Denies cough, dyspnea or wheezes Cardiovascular: Denies palpitation, chest discomfort or lower extremity swelling Gastrointestinal:  Denies nausea, heartburn or change in bowel habits Skin: Denies abnormal skin rashes Lymphatics: Denies new lymphadenopathy or easy bruising Neurological:Denies numbness, tingling  or new weaknesses Behavioral/Psych: Mood is stable, no new changes  All other systems were reviewed with the patient and are negative.   MEDICAL HISTORY:  Past Medical History:  Diagnosis Date   Allergy 1973   Antihistamines   Arthritis 2003   Breast cancer ([place]) 09/17/2021   Hypertension    Skin cancer 2006    SURGICAL HISTORY: Past Surgical History:  Procedure Laterality Date   EYE SURGERY  2017   cataracts   FOOT SURGERY Left 2012   FRACTURE SURGERY  2014 and 2018   screw in left foot    SOCIAL HISTORY: Social History   Socioeconomic History   Marital status: Single    Spouse name: Not on file   Number of children: 1   Years of education: Not on file   Highest education level: Not on file  Occupational History   Not on file  Tobacco Use   Smoking status: Former    Packs/day: 0.00    Years: 0.50    Pack years: 0.00    Types: Cigarettes    Quit date: 01/31/1973    Years since quitting: 48.7   Smokeless tobacco: Never   Tobacco comments:    Mostly passive,  smoked some with college friends  Vaping Use   Vaping Use: Never used  Substance and Sexual Activity   Alcohol use: No   Drug use: Never   Sexual activity: Not Currently    Birth control/protection: None    Comment: I am 80, have not been active for more  than 40 years  Other Topics Concern   Not on file  Social History Narrative   Not on file   Social Determinants  of Health   Financial Resource Strain: Not on file  Food Insecurity: Not on file  Transportation Needs: Not on file  Physical Activity: Not on file  Stress: Not on file  Social Connections: Not on file  Intimate Partner Violence: Not At Risk   Fear of Current or Ex-Partner: No   Emotionally Abused: No   Physically Abused: No   Sexually Abused: No    FAMILY HISTORY: Family History  Problem Relation Age of Onset   Arthritis Mother    Hypertension Mother    Hyperlipidemia Mother    Mitral valve prolapse Mother    Pancreatic  cancer Father    Cancer Father    Arthritis Sister    Hyperlipidemia Sister    Hypertension Sister    Hyperlipidemia Sister    Arthritis Maternal Grandmother    Hypertension Maternal Grandmother    Stroke Maternal Grandmother    Diabetes Maternal Grandfather    Arthritis Paternal Grandmother    Macular degeneration Paternal Grandmother    Heart disease Paternal Grandmother    Thyroid cancer Son    Breast cancer Neg Hx     ALLERGIES:  is allergic to antihistamines, chlorpheniramine-type.  MEDICATIONS:  Current Outpatient Medications  Medication Sig Dispense Refill   aspirin EC 81 MG tablet Take 1 tablet (81 mg total) by mouth daily. Swallow whole. 90 tablet 3   atorvastatin (LIPITOR) 40 MG tablet TAKE 1 TABLET BY MOUTH DAILY 90 tablet 1   Coenzyme Q10 (COQ10) 100 MG CAPS Take 1 capsule by mouth daily.     metoprolol succinate (TOPROL-XL) 25 MG 24 hr tablet Take 1 tablet (25 mg total) by mouth daily. 90 tablet 1   NON FORMULARY Take 1 capsule by mouth daily. Vitamin K2 D3     No current facility-administered medications for this visit.    PHYSICAL EXAMINATION: ECOG PERFORMANCE STATUS: 0 - Asymptomatic  Vitals:   10/10/21 1456  BP: (!) 144/73  Pulse: 70  Resp: 16  Temp: 97.8 F (36.6 C)  SpO2: 99%   Filed Weights   10/10/21 1456  Weight: 167 lb 12.8 oz (76.1 kg)    GENERAL:alert, no distress and comfortable SKIN: skin color, texture, turgor are normal, no rashes or significant lesions EYES: normal, Conjunctiva are pink and non-injected, sclera clear  NECK: supple, thyroid normal size, non-tender, without nodularity LYMPH:  no palpable lymphadenopathy in the cervical, axillary  LUNGS: clear to auscultation and percussion with normal breathing effort HEART: regular rate & rhythm and no murmurs and no lower extremity edema ABDOMEN:abdomen soft, non-tender and normal bowel sounds Musculoskeletal:no cyanosis of digits and no clubbing  NEURO: alert & oriented x 3 with  fluent speech, no focal motor/sensory deficits BREAST: 1 cm post-biopsy bruising/nodule. No other palpable mass, nodules or adenopathy bilaterally. Breast exam benign.  LABORATORY DATA:  I have reviewed the data as listed CBC Latest Ref Rng & Units 07/28/2017  WBC 4.0 - 10.5 K/uL 7.0  Hemoglobin 12.0 - 15.0 g/dL 13.7  Hematocrit 36.0 - 46.0 % 42.2  Platelets 150.0 - 400.0 K/uL 258.0    CMP Latest Ref Rng & Units 07/28/2017 08/14/2016  Glucose 70 - 99 mg/dL 90 -  BUN 6 - 23 mg/dL 14 -  Creatinine 0.40 - 1.20 mg/dL 0.74 0.7  Sodium 135 - 145 mEq/L 139 145  Potassium 3.5 - 5.1 mEq/L 4.5 4.5  Chloride 96 - 112 mEq/L 104 -  CO2 19 - 32 mEq/L 28 -  Calcium 8.4 - 10.5 mg/dL  10.2 -  Total Protein 6.0 - 8.3 g/dL 7.2 -  Total Bilirubin 0.2 - 1.2 mg/dL 0.4 -  Alkaline Phos 39 - 117 U/L 101 -  AST 0 - 37 U/L 16 16  ALT 0 - 35 U/L 15 14     RADIOGRAPHIC STUDIES: I have personally reviewed the radiological images as listed and agreed with the findings in the report. CT Chest Wo Contrast  Result Date: 09/20/2021 CLINICAL DATA:  A 71 year old female presents with history of breast cancer for lung nodule follow-up. New diagnosis of breast cancer on the RIGHT. EXAM: CT CHEST WITHOUT CONTRAST TECHNIQUE: Multidetector CT imaging of the chest was performed following the standard protocol without IV contrast. COMPARISON:  June 07, 2021. FINDINGS: Cardiovascular: Calcified atheromatous plaque of the thoracic aorta is mild. No aortic dilation. Heart size is normal without substantial pericardial effusion or nodularity. Central pulmonary vasculature is normal caliber. Limited assessment of cardiovascular structures given lack of intravenous contrast. Mediastinum/Nodes: No internal mammary, thoracic inlet or axillary lymphadenopathy. No mediastinal lymphadenopathy. No gross hilar lymphadenopathy. Signs of prior granulomatous disease of the RIGHT hilum with calcified lymph nodes in the chest. Esophagus  grossly normal. Lungs/Pleura: Granuloma with central calcification in the RIGHT lower lobe (image 116/8) 13 mm. Inspissated secretions in peripheral bronchi in the RIGHT lower lobe similar to prior imaging. Airways are patent. Tubular structure extending along bronchovascular structures in the RIGHT upper lobe (image 41/8) compatible with mucous plugging within a bronchus. The nodule that was present adjacent to this area on the prior study has resolved compatible with resolving post inflammatory changes. Airways are patent. Minimal scarring in the lingula. Upper Abdomen: Incidental imaging of upper abdominal contents without acute process. No upper abdominal lymphadenopathy. Musculoskeletal: 13 x 11 mm area of increased density new since very recent imaging in the RIGHT breast with small locule of gas adjacent, likely represents the site of biopsy and is associated with small metallic marker. No acute bone finding. No destructive bone process. Spinal degenerative changes. IMPRESSION: 1. Signs of breast biopsy in the RIGHT breast. 2. Findings of prior granulomatous disease with pulmonary granuloma in the RIGHT lower lobe. 3. Resolution of inflammatory changes about mucoid impaction in a RIGHT upper lobe bronchus. 4. Aortic atherosclerosis. Aortic Atherosclerosis (ICD10-I70.0). Electronically Signed   By: Zetta Bills M.D.   On: 09/20/2021 19:20   DG BONE DENSITY (DXA)  Result Date: 09/20/2021 EXAM: DUAL X-RAY ABSORPTIOMETRY (DXA) FOR BONE MINERAL DENSITY IMPRESSION: Referring Physician:  Willey Blade Your patient completed a bone mineral density test using GE Lunar iDXA system (analysis version: 16). Technologist: [place] PATIENT: Name: Shekina, Cordell Patient ID: [id number] Birth Date: [birth year] Height: 65.5 in. Sex: Female Measured: 09/20/2021 Weight: 167.6 lbs. Indications: Caucasian, Estrogen Deficient, Family Hist. (Parent hip fracture), History of Fracture (Adult) (V15.51), Postmenopausal Fractures:  Left Foot, Right Hand Treatments: Vitamin D (E933.5) ASSESSMENT: The BMD measured at Femur Total Right is 0.791 g/cm2 with a T-score of -1.7. This patient is considered osteopenic/low bone mass according to [facility]) criteria. The quality of the exam is good. L3, L4 was excluded due to degenerative changes. Site Region Measured Date Measured Age YA BMD Significant CHANGE T-score AP Spine  L1-L2       09[age]20/2022    69.3         -1.6    0.973 g/cm2 DualFemur Total Right 09[age]20/2022    69.3         -1.7    0.791 g/cm2 DualFemur  Total Mean  09[age]20/2021    69.3         -1.7    0.795 g/cm2 World Health Organization [facility]) criteria for post-menopausal, Caucasian Women: Normal       T-score at or above -1 SD Osteopenia   T-score between -1 and -2.5 SD Osteoporosis T-score at or below -2.5 SD RECOMMENDATION: 1. All patients should optimize calcium and vitamin D intake. 2. Consider FDA-approved medical therapies in postmenopausal women and men aged 70 years and older, based on the following: a. A hip or vertebral (clinical or morphometric) fracture. b. T-score = -2.5 at the femoral neck or spine after appropriate evaluation to exclude secondary causes. c. Low bone mass (T-score between -1.0 and -2.5 at the femoral neck or spine) and a 10-year probability of a hip fracture = 3% or a 10-year probability of a major osteoporosis-related fracture = 20% based on the [place]-adapted WHO algorithm. d. Clinician judgment and/or patient preferences may indicate treatment for people with 10-year fracture probabilities above or below these levels. FOLLOW-UP: Patients with diagnosis of osteoporosis or at high risk for fracture should have regular bone mineral density tests.? Patients eligible for Medicare are allowed routine testing every 2 years.? The testing frequency can be increased to one year for patients who have rapidly progressing disease, are receiving or discontinuing medical therapy to restore bone mass, or have  additional risk factors. I have reviewed this study and agree with the findings. [facility] FRAX* 10-year Probability of Fracture Based on femoral neck BMD: DualFemur (Right) Major Osteoporotic Fracture: 25.3% Hip Fracture:                4.4% Population:                  [place] (Caucasian) Risk Factors: Family Hist. (Parent hip fracture), History of Fracture (Adult) (V15.51) *FRAX is a Materials engineer of the [employer] for Metabolic Bone Disease, a World Pharmacologist (WHO) [facility]. ASSESSMENT: The probability of a major osteoporotic fracture is 25.3% within the next ten years. The probability of a hip fracture is 4.4% within the next ten years. Electronically Signed   By: Lajean Manes M.D.   On: 09/20/2021 16:04   MM CLIP PLACEMENT RIGHT  Addendum Date: 09/17/2021   ADDENDUM REPORT: 09/17/2021 13:28 ADDENDUM: This was a right mammogram, not a left. Electronically Signed   By: Dorise Bullion III M.D.   On: 09/17/2021 13:28   Result Date: 09/17/2021 CLINICAL DATA:  Evaluate biopsy marker EXAM: 3D DIAGNOSTIC LEFT MAMMOGRAM POST ULTRASOUND BIOPSY COMPARISON:  Previous exam(s). FINDINGS: 3D Mammographic images were obtained following ultrasound guided biopsy of a right breast mass. The biopsy marking clip is along the superolateral aspect of the biopsied mass. IMPRESSION: Appropriate positioning of the ribbon shaped biopsy marking clip at along the superolateral aspect of the biopsied right breast mass. Final Assessment: Post Procedure Mammograms for Marker Placement Electronically Signed: By: Dorise Bullion III M.D. On: 09/17/2021 10:58  [place] RT BREAST BX W LOC DEV 1ST LESION IMG BX SPEC US GUIDE  Addendum Date: 09/19/2021   ADDENDUM REPORT: 09/19/2021 08:15 ADDENDUM: Pathology revealed GRADE II INVASIVE MAMMARY CARCINOMA of the RIGHT breast, 11 o'clock, (ribbon clip). This was found to be concordant by Dr. Dorise Bullion. Pathology results  were discussed with the patient by telephone. The patient reported doing well after the biopsy with tenderness and bruising at the site. Post biopsy instructions and care were reviewed and questions  were answered. The patient was encouraged to call [facility] for any additional concerns. My direct phone number was provided. Surgical consultation has been arranged with Dr. Autumn Messing at [facility] on September 26, 2021. Pathology results reported by Terie Purser, RN on 09/18/2021. Electronically Signed   By: Dorise Bullion III M.D.   On: 09/19/2021 08:15   Result Date: 09/19/2021 CLINICAL DATA:  Biopsy of an 11 o'clock right breast mass EXAM: ULTRASOUND GUIDED RIGHT BREAST CORE NEEDLE BIOPSY COMPARISON:  Previous exam(s). PROCEDURE: I met with the patient and we discussed the procedure of ultrasound-guided biopsy, including benefits and alternatives. We discussed the high likelihood of a successful procedure. We discussed the risks of the procedure, including infection, bleeding, tissue injury, clip migration, and inadequate sampling. Informed written consent was given. The usual time-out protocol was performed immediately prior to the procedure. Lesion quadrant: 11 o'clock right breast Using sterile technique and 1% Lidocaine as local anesthetic, under direct ultrasound visualization, a 12 gauge spring-loaded device was used to perform biopsy of an 11 o'clock right breast mass using a lateral approach. At the conclusion of the procedure a ribbon shaped tissue marker clip was deployed into the biopsy cavity. Follow up 2 view mammogram was performed and dictated separately. IMPRESSION: Ultrasound guided biopsy of an 11 o'clock right breast mass. No apparent complications. Electronically Signed: By: Dorise Bullion III M.D. On: 09/17/2021 10:49    No orders of the defined types were placed in this encounter.   All questions were answered. The patient knows to call the  clinic with any problems, questions or concerns. The total time spent in the appointment was 45 minutes.     Truitt Merle, MD 10/10/2021 3:11 PM  I, Wilburn Mylar, am acting as scribe for Truitt Merle, MD.   I have reviewed the above documentation for accuracy and completeness, and I agree with the above.

## 2021-10-13 ENCOUNTER — Other Ambulatory Visit: Payer: Self-pay

## 2021-10-13 ENCOUNTER — Ambulatory Visit
Admission: RE | Admit: 2021-10-13 | Discharge: 2021-10-13 | Disposition: A | Discharge status: Home / Self Care | Payer: Medicare Other | Source: Ambulatory Visit | Attending: General Surgery | Admitting: General Surgery

## 2021-10-13 ENCOUNTER — Encounter: Payer: Self-pay | Admitting: Hematology

## 2021-10-13 DIAGNOSIS — C50411 Malignant neoplasm of upper-outer quadrant of right female breast: Secondary | ICD-10-CM

## 2021-10-13 DIAGNOSIS — Z170 Estrogen receptor positive status [ER+]: Secondary | ICD-10-CM

## 2021-10-13 IMAGING — MR MR BREAST BILAT WO/W CM
8 of 12 series · 32 of 48 positions shown · IV contrast (7 ml gadavist)
Comparison: Previous exam(s).

CLINICAL DATA: 70-year-old female with newly diagnosed right breast
cancer.

EXAM:
BILATERAL BREAST MRI WITH AND WITHOUT CONTRAST
TECHNIQUE: Multiplanar, multisequence MR images of both breasts were obtained
prior to and following the intravenous administration of 7 ml of
Gadavist.

[Series 2: t2_tirm_tra ipat (a-p) · axial · 3.0mm · 0.70mm/px · 1 of 55 slices shown]
[im 1/55]
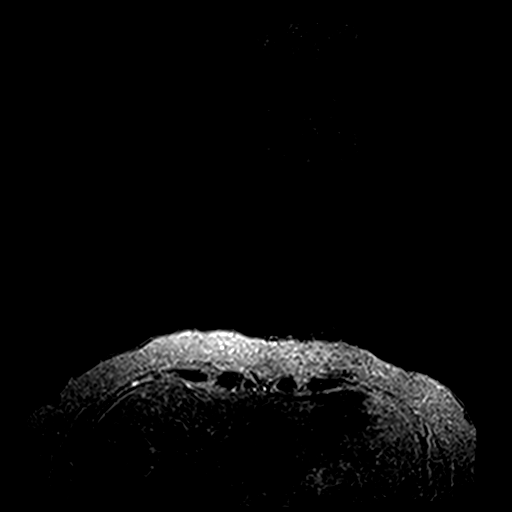

[Series 3: fl3d pre-cm no · axial · non-contrast · 1.2mm · 0.89mm/px · z∈[-61,+111]mm · 5 of 144 slices shown]
[im 1/144]
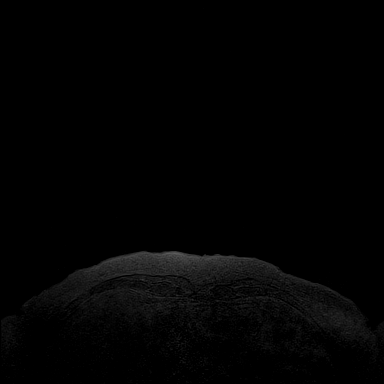
[im 36/144]
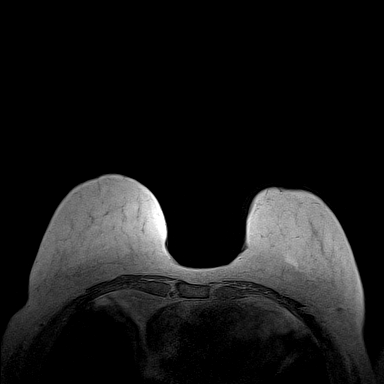
[im 72/144]
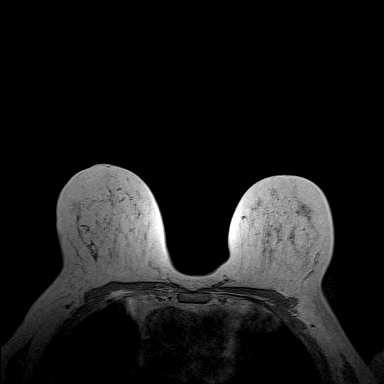
[im 108/144]
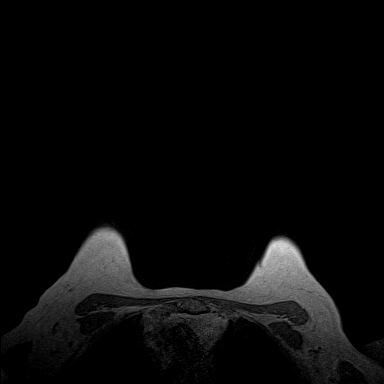
[im 144/144]
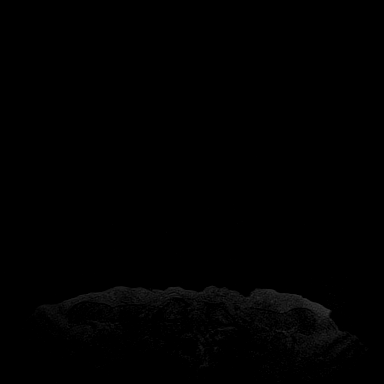

[Series 4: fl3d pre-cm · axial · non-contrast · 1.2mm · 0.89mm/px · z∈[-61,+111]mm · 5 of 144 slices shown]
[im 1/144]
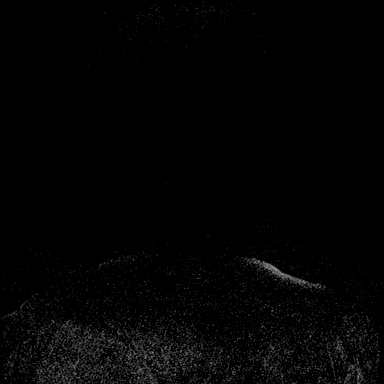
[im 36/144]
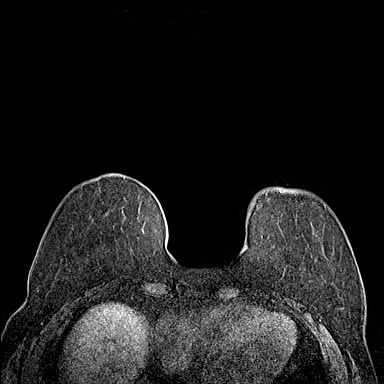
[im 72/144]
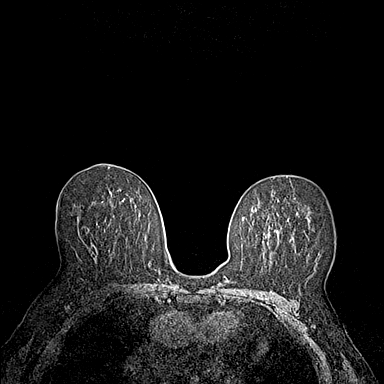
[im 108/144]
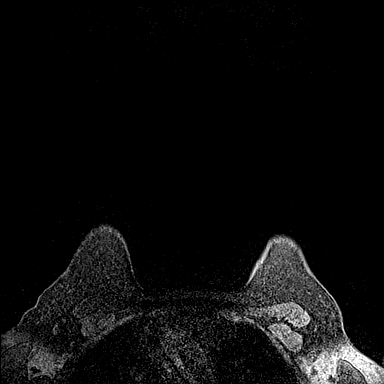
[im 144/144]
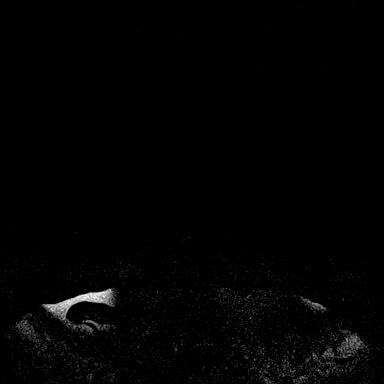

[Series 5: fl3d post-cm 20 · axial · 1.2mm · 0.89mm/px · z∈[-61,+111]mm · 5 of 144 slices shown (1 of 3)]
[im 1/144]
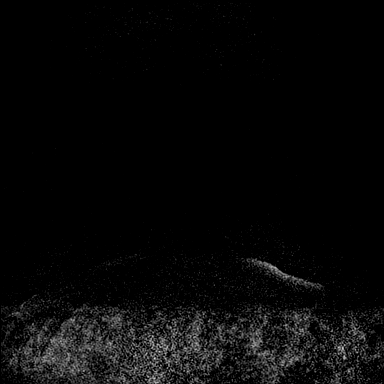
[im 36/144]
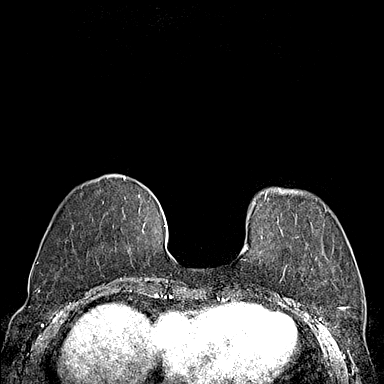
[im 72/144]
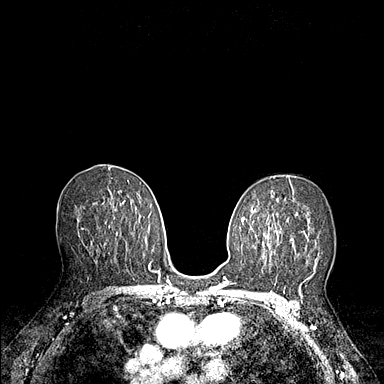
[im 108/144]
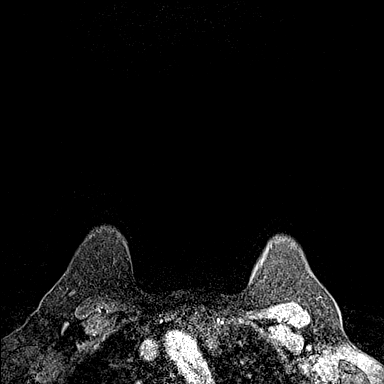
[im 144/144]
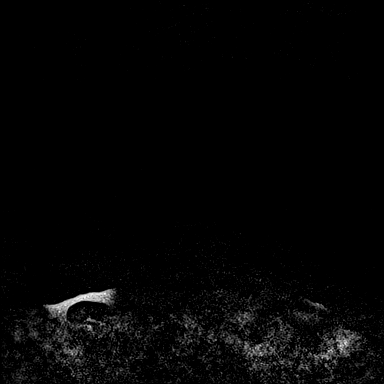

[Series 6: fl3d post-cm 20 · axial · 1.2mm · 0.89mm/px · z∈[-61,+111]mm · 5 of 144 slices shown (2 of 3)]
[im 1/144]
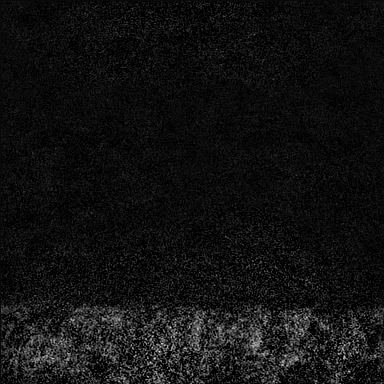
[im 36/144]
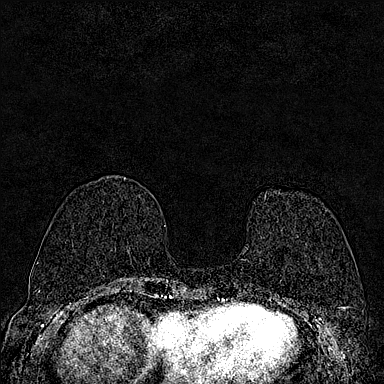
[im 72/144]
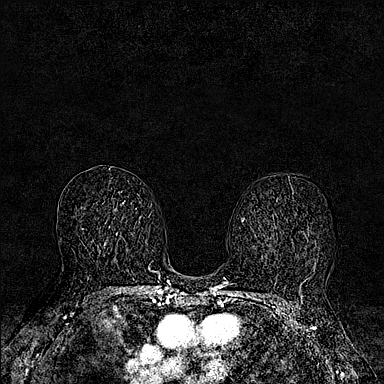
[im 108/144]
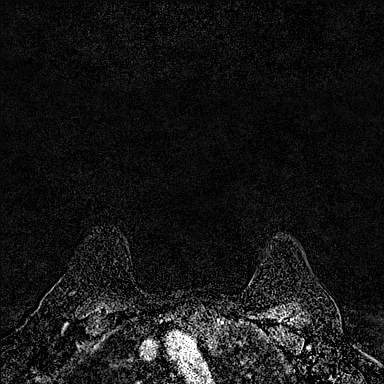
[im 144/144]
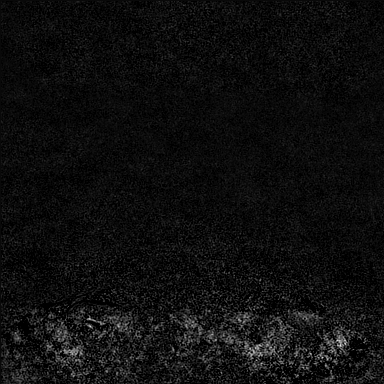

[Series 7: fl3d post-cm 20 · axial · 172.8mm · 0.89mm/px · 1 of 1 slices shown (3 of 3)]
[im 1/1]
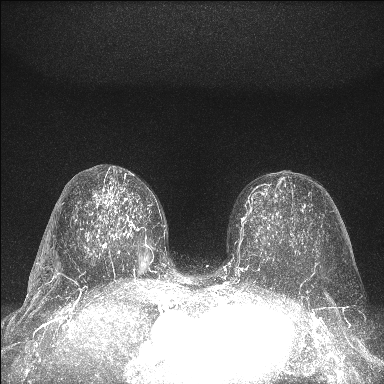

[Series 8: fl3d post-cm 3 · axial · 1.2mm · 0.89mm/px · z∈[-61,+111]mm · 6 of 144 slices shown (1 of 2)]
[im 1/144]
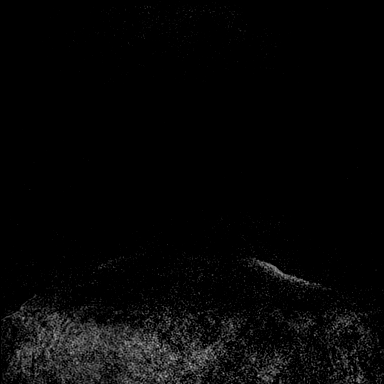
[im 29/144]
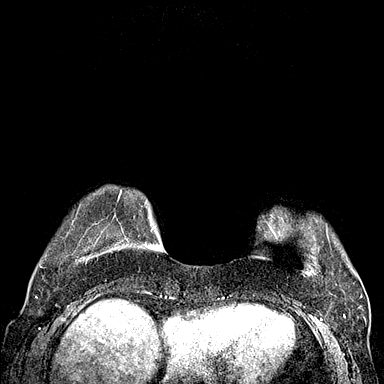
[im 58/144]
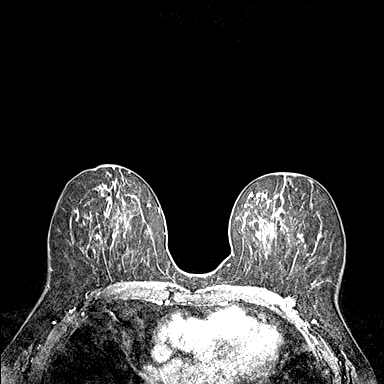
[im 86/144]
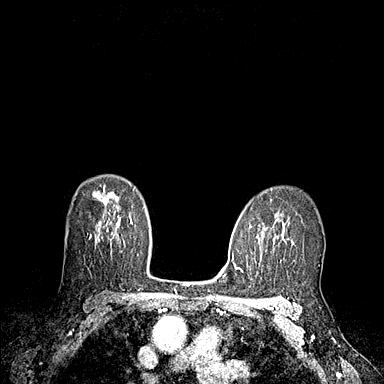
[im 115/144]
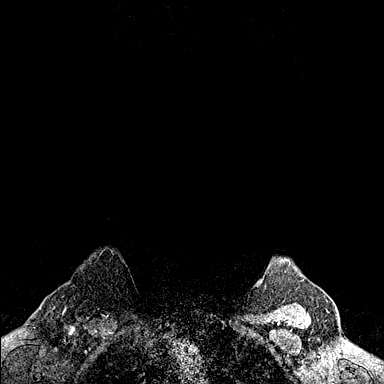
[im 144/144]
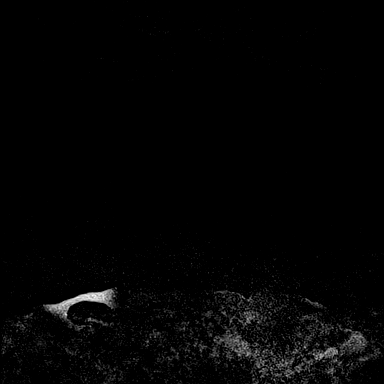

[Series 9: fl3d post-cm 3 · axial · 1.2mm · 0.89mm/px · z∈[-61,+41]mm · 4 of 144 slices shown (2 of 2)]
[im 1/144]
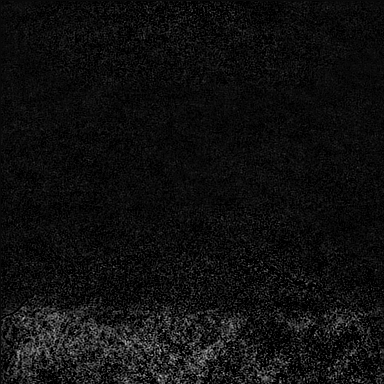
[im 29/144]
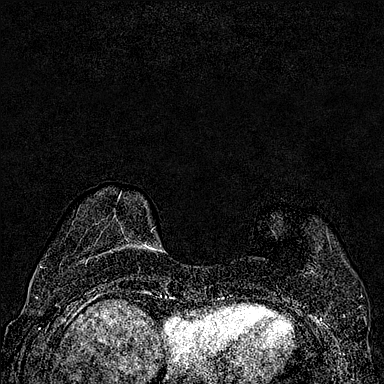
[im 58/144]
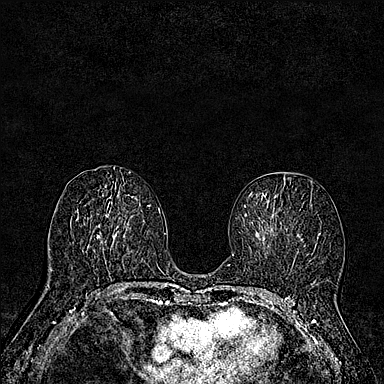
[im 86/144]
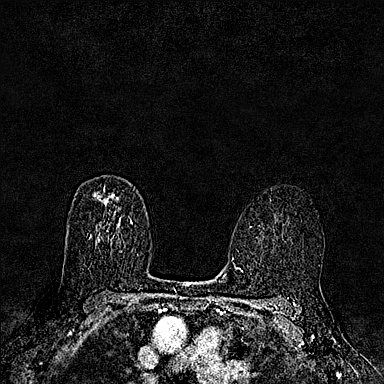

[32 of 48 positions shown; findings below may reference images not displayed]

Three-dimensional MR images were rendered by post-processing of the
original MR data on an independent workstation. The
three-dimensional MR images were interpreted, and findings are
reported in the following complete MRI report for this study. Three
dimensional images were evaluated at the independent interpreting
workstation using the DynaCAD thin client.
FINDINGS: Breast composition: b. Scattered fibroglandular tissue.

Background parenchymal enhancement: Moderate.

Right breast: Susceptibility artifact from post biopsy clip is seen
in association with an irregular, enhancing mass in the upper-outer
quadrant of the right breast at anterior depth (series 9, image
61/144). This is consistent with the patient's biopsy-proven
malignancy. It measures 2.4 x 2.0 x 1.4 cm. No other suspicious
masslike or non mass enhancement in the remainder of the right
breast.

Left breast: There is a slightly irregular, enhancing 5 mm focus in
the upper outer inner quadrant of the left breast at mid to anterior
depth (series 9, image 64/144). This demonstrates progressive
enhancement kinetics.

Lymph nodes: No abnormal appearing lymph nodes.

Ancillary findings:  None.
IMPRESSION: 1. 2.4 cm enhancing mass in the upper-outer quadrant of the right
breast consistent with the patient's biopsy-proven malignancy.
2. Indeterminate 5 mm enhancing focus in the upper inner quadrant of
the left breast (series 9, image 64). Recommendation is for direct
tissue sampling.
3. No suspicious lymphadenopathy.

RECOMMENDATION:
MRI guided biopsy of a 5 mm enhancing focus in the upper inner
quadrant of the left breast (series 9, image 64). This is felt
unlikely to be seen by second-look ultrasound given the size.

BI-RADS CATEGORY  4: Suspicious.

## 2021-10-13 MED ORDER — GADOBUTROL 1 MMOL/ML IV SOLN
7.0000 mL | Freq: Once | INTRAVENOUS | Status: AC | PRN
  Administered 2021-10-13: 7 mL via INTRAVENOUS

## 2021-10-14 ENCOUNTER — Encounter: Payer: Self-pay | Admitting: *Deleted

## 2021-10-14 ENCOUNTER — Telehealth: Payer: Self-pay | Admitting: *Deleted

## 2021-10-14 NOTE — Telephone Encounter (Signed)
Spoke to pt provided navigation resources and contact information. Denies questions or concerns regarding dx or treatment care plan. Encourage pt to call with needs. Received verbal understanding.

## 2021-10-15 ENCOUNTER — Encounter: Payer: Self-pay | Admitting: *Deleted

## 2021-10-15 ENCOUNTER — Other Ambulatory Visit: Payer: Self-pay | Admitting: General Surgery

## 2021-10-15 DIAGNOSIS — R9389 Abnormal findings on diagnostic imaging of other specified body structures: Secondary | ICD-10-CM

## 2021-10-16 ENCOUNTER — Ambulatory Visit (HOSPITAL_BASED_OUTPATIENT_CLINIC_OR_DEPARTMENT_OTHER): Admission: RE | Admit: 2021-10-16 | Payer: Medicare Other | Source: Home / Self Care | Admitting: General Surgery

## 2021-10-16 ENCOUNTER — Encounter (HOSPITAL_BASED_OUTPATIENT_CLINIC_OR_DEPARTMENT_OTHER): Admission: RE | Payer: Self-pay | Source: Home / Self Care

## 2021-10-16 ENCOUNTER — Ambulatory Visit
Admission: RE | Admit: 2021-10-16 | Discharge: 2021-10-16 | Disposition: A | Discharge status: Home / Self Care | Payer: Medicare Other | Source: Ambulatory Visit | Attending: General Surgery | Admitting: General Surgery

## 2021-10-16 ENCOUNTER — Other Ambulatory Visit (HOSPITAL_COMMUNITY): Payer: Self-pay | Admitting: Diagnostic Radiology

## 2021-10-16 ENCOUNTER — Other Ambulatory Visit: Payer: Self-pay

## 2021-10-16 DIAGNOSIS — R9389 Abnormal findings on diagnostic imaging of other specified body structures: Secondary | ICD-10-CM

## 2021-10-16 IMAGING — MG MM BREAST LOCALIZATION CLIP
4 series · 4 of 12 positions shown · non-contrast
Comparison: Previous exam(s).

CLINICAL DATA: Status post MR guided core biopsy of the left
breast.

EXAM:
3D DIAGNOSTIC LEFT MAMMOGRAM POST MRI BIOPSY

[L CC synth-2D]
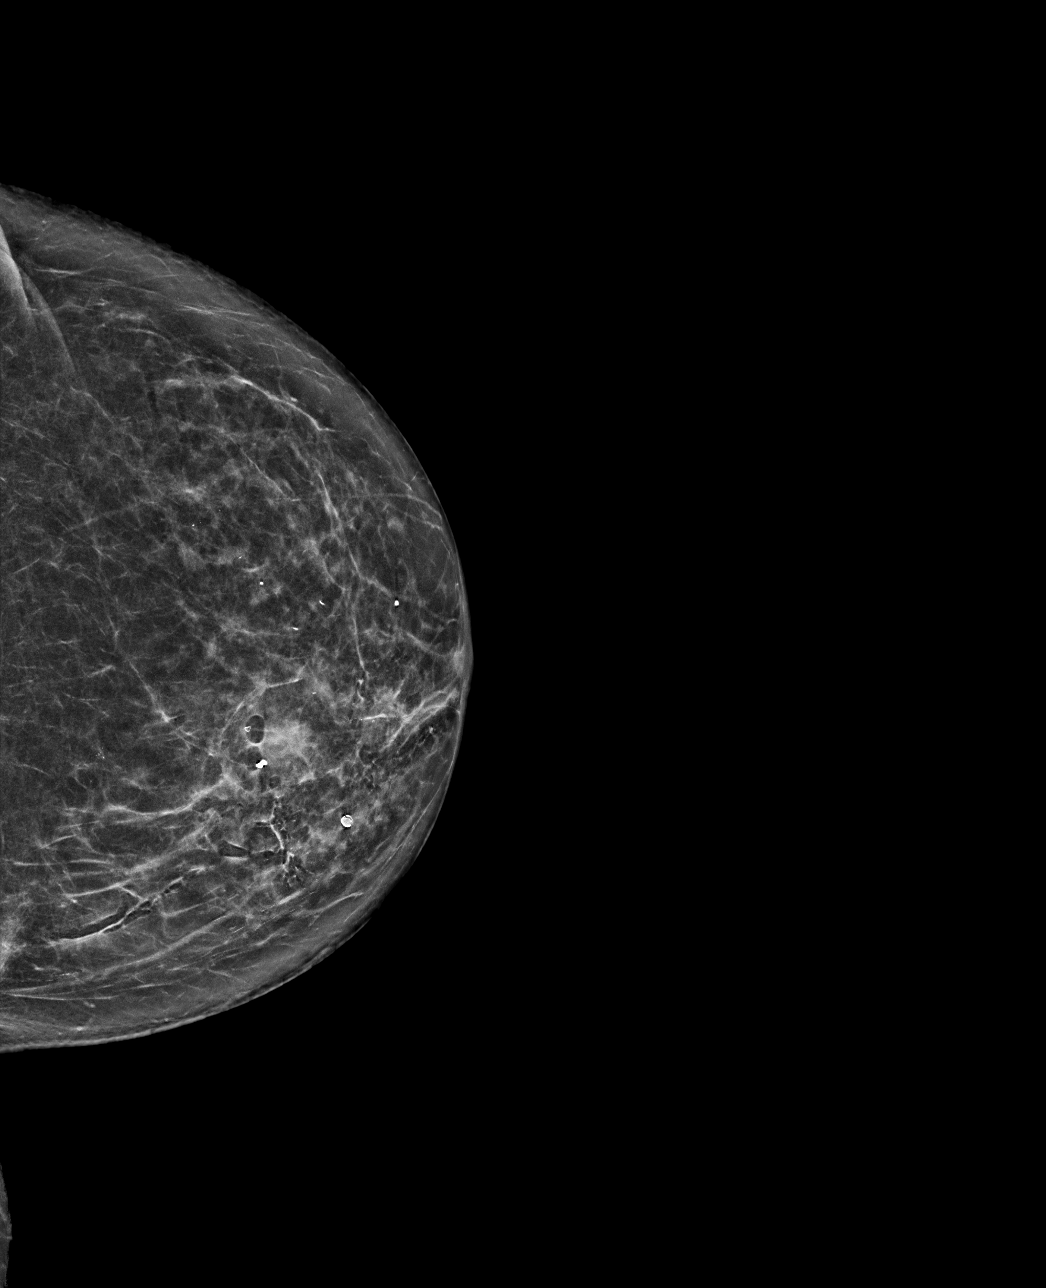

[L ML synth-2D]
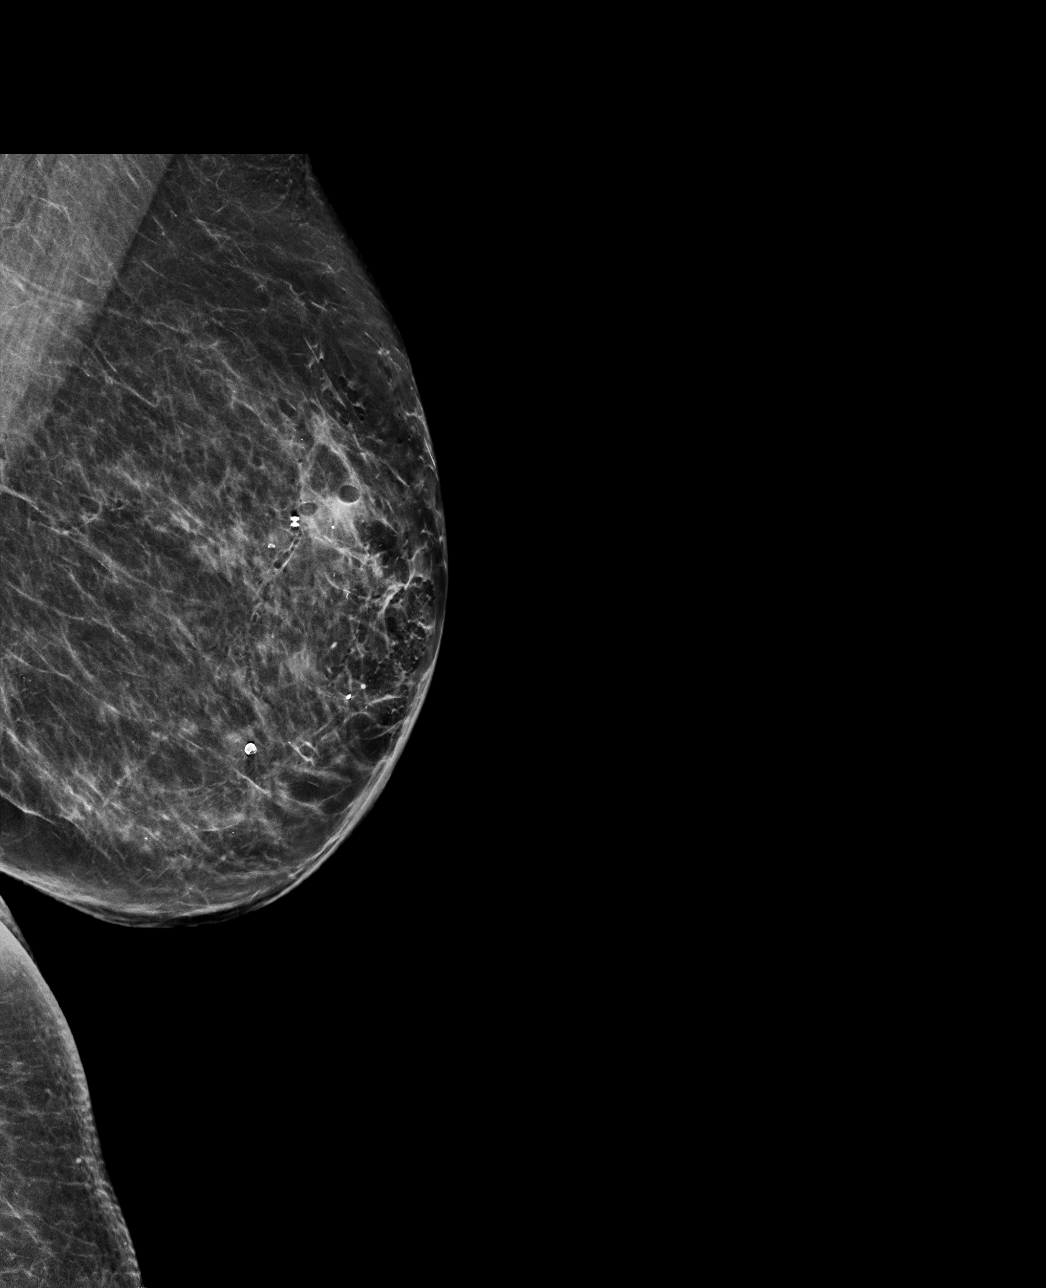

[L CC tomo · tomo slice 31/62.0]
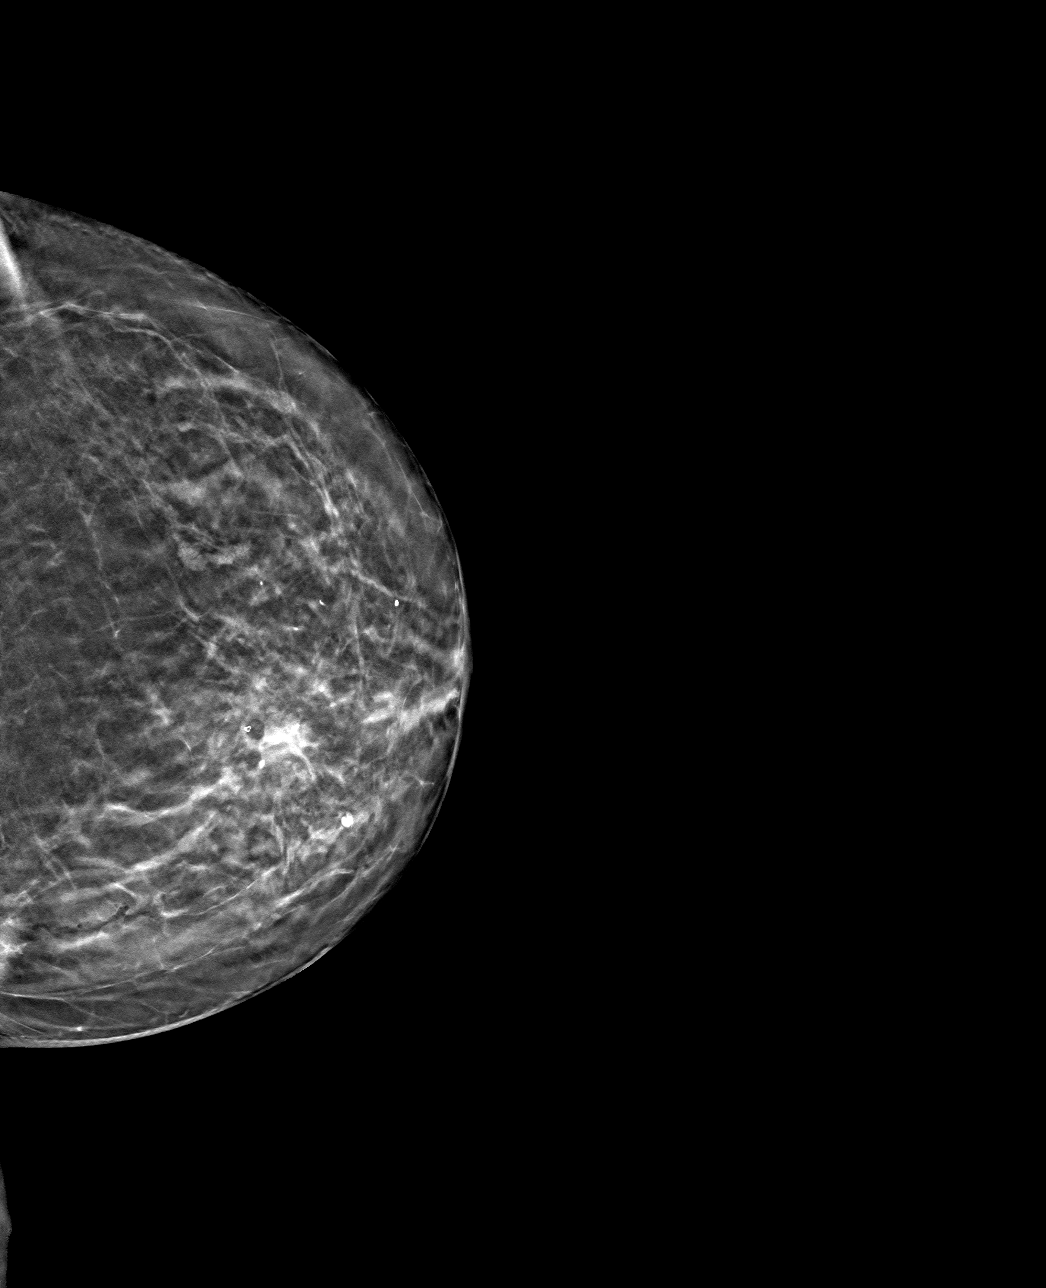

[L ML tomo · tomo slice 36/71.0]
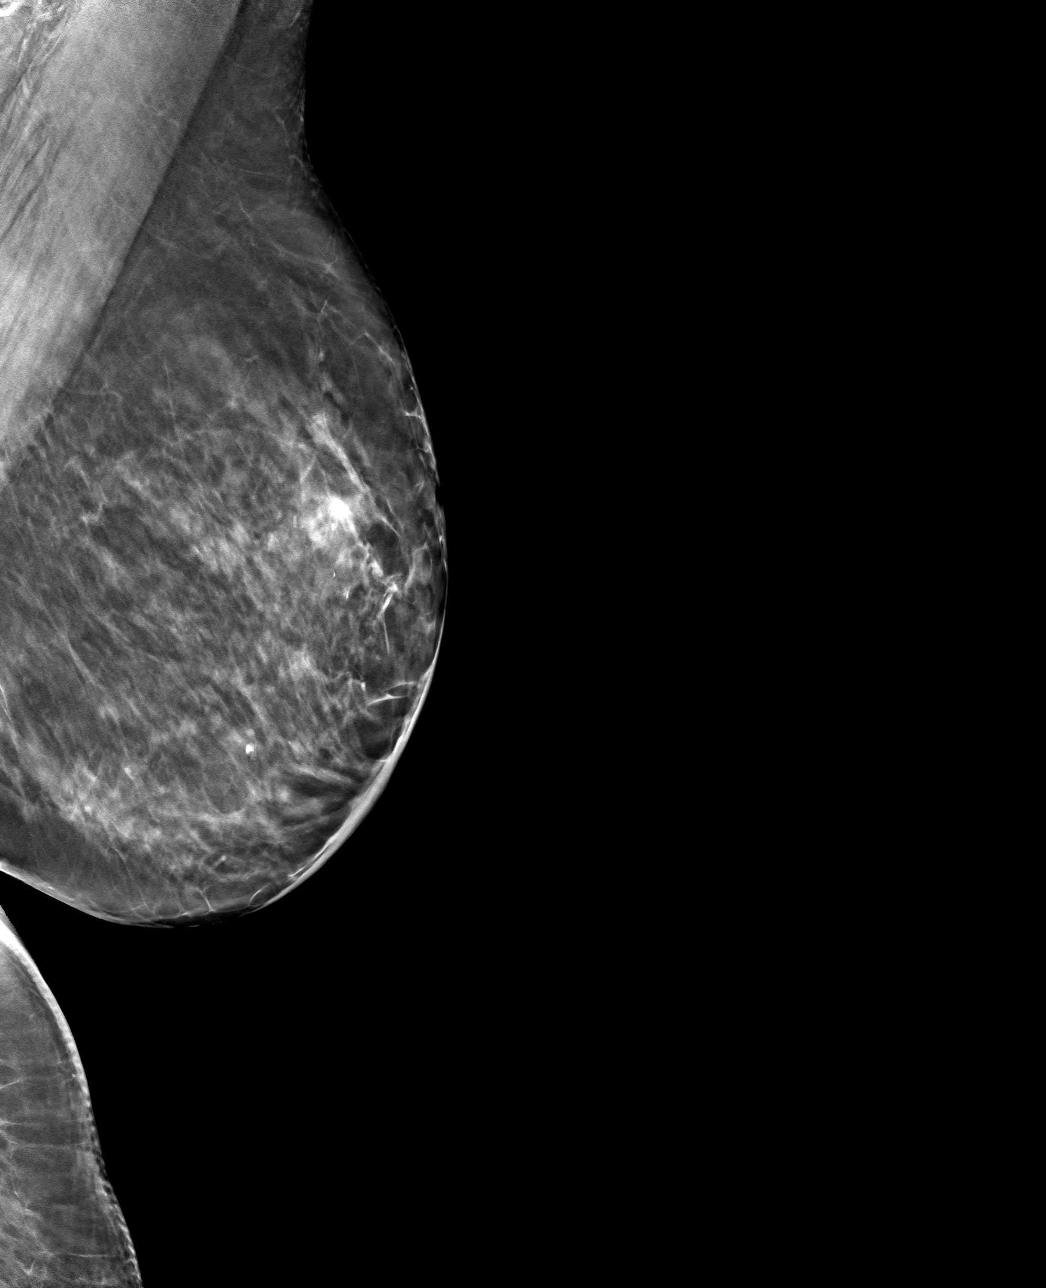

[4 of 12 positions shown; findings below may reference images not displayed]

FINDINGS: 3D Mammographic images were obtained following MRI guided biopsy of
the left breast. The biopsy marking clip is in expected location in
the upper-inner quadrant of the left breast.
IMPRESSION: Appropriate positioning of the dumbbell shaped biopsy marking clip
at the site of biopsy in the upper-inner quadrant of the left
breast.

Final Assessment: Post Procedure Mammograms for Marker Placement

## 2021-10-16 IMAGING — MR MR BREAST BX W LOC DEV 1ST LESION IMAGE BX SPEC MR GUIDE*L*
9 of 12 series · 33 of 48 positions shown · IV contrast (6 ML GADAVIST)
Comparison: Previous exams.
COMPARISON: Previous exams.

Addendum:
CLINICAL DATA: Biopsy proven right breast cancer. Indeterminate
mass in the left breast seen with MRI. Biopsy recommended.

EXAM:
MRI GUIDED CORE NEEDLE BIOPSY OF THE LEFT BREAST
TECHNIQUE: Multiplanar, multisequence MR imaging of the the left breast breast
was performed both before and after administration of intravenous
contrast.
CONTRAST:  6mL GADAVIST GADOBUTROL 1 MMOL/ML IV SOLN

[Series 2: fiducial unilateral · sagittal · 2.0mm · 1.33mm/px · 2 of 51 slices shown]
[im 1/51]
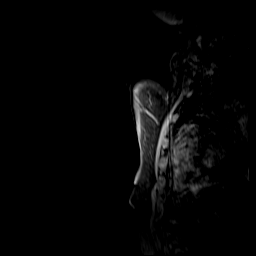
[im 51/51]
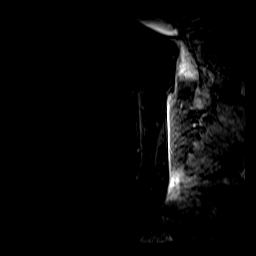

[Series 3: dynamic pre · axial · non-contrast · 1.3mm · 0.73mm/px · z∈[-86,+100]mm · 5 of 144 slices shown]
[im 1/144]
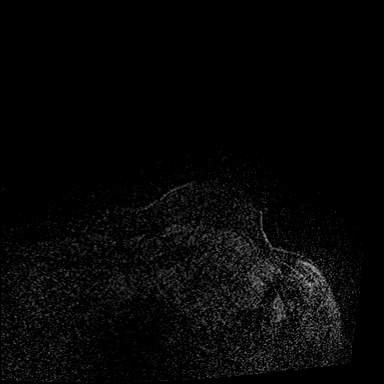
[im 36/144]
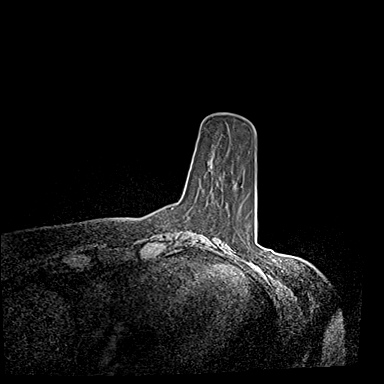
[im 72/144]
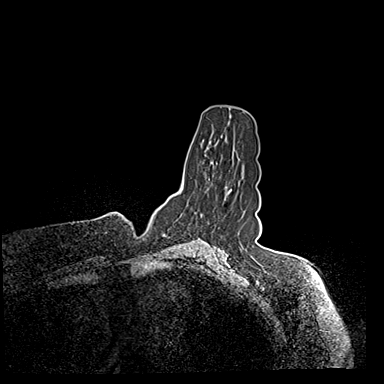
[im 108/144]
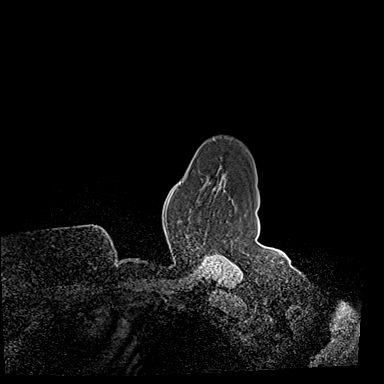
[im 144/144]
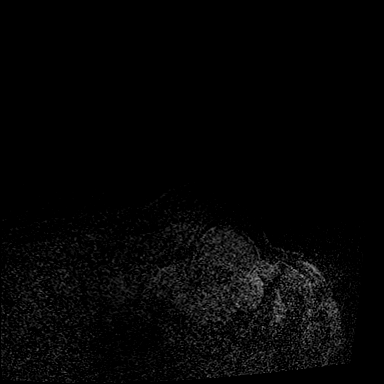

[Series 4: dynamic post 20 · axial · 1.3mm · 0.73mm/px · z∈[-86,+100]mm · 5 of 144 slices shown (1 of 2)]
[im 1/144]
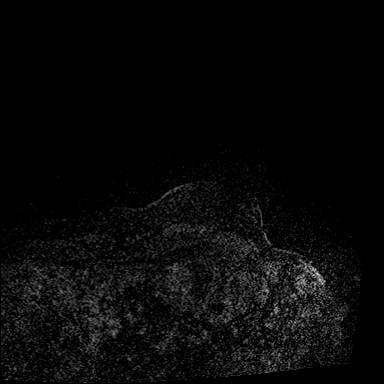
[im 36/144]
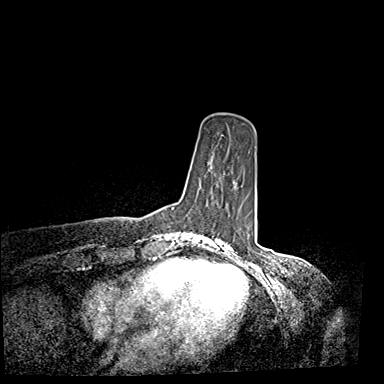
[im 72/144]
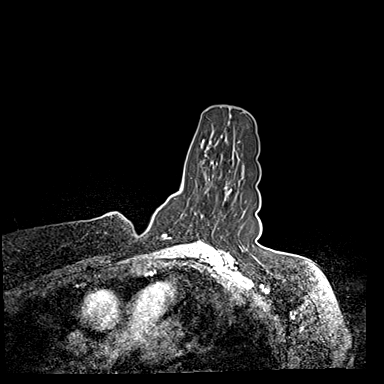
[im 108/144]
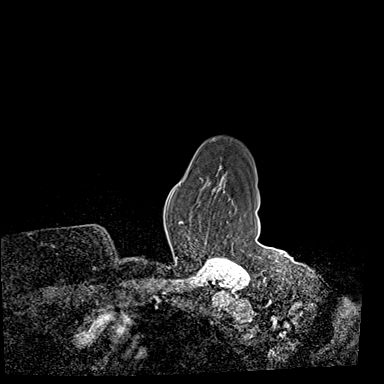
[im 144/144]
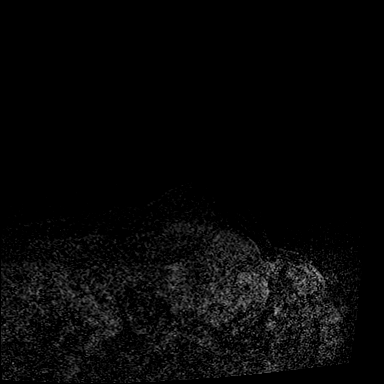

[Series 5: dynamic post 20 · axial · 1.3mm · 0.73mm/px · z∈[-86,+100]mm · 4 of 144 slices shown (2 of 2)]
[im 1/144]
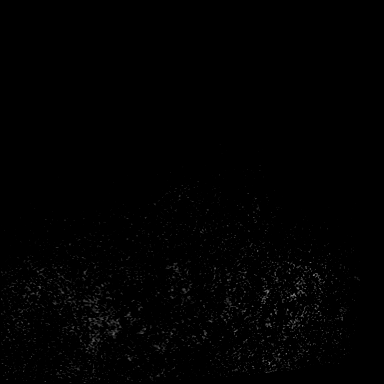
[im 48/144]
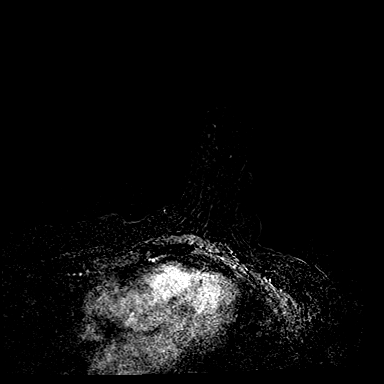
[im 96/144]
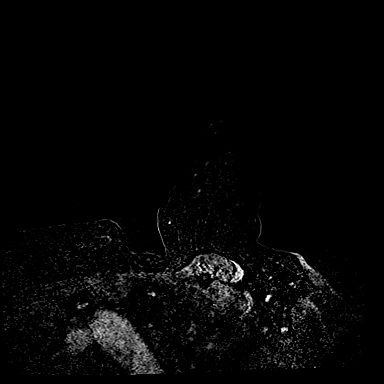
[im 144/144]
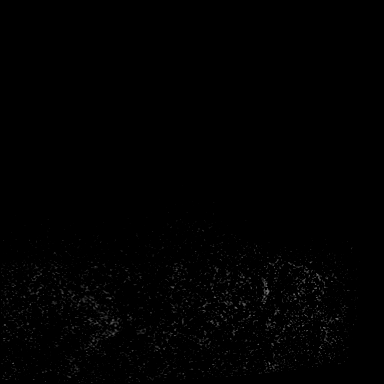

[Series 6: dynamic post 3 · axial · 1.3mm · 0.73mm/px · z∈[-86,+100]mm · 4 of 144 slices shown (1 of 2)]
[im 1/144]
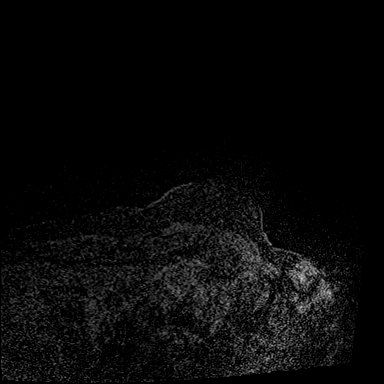
[im 48/144]
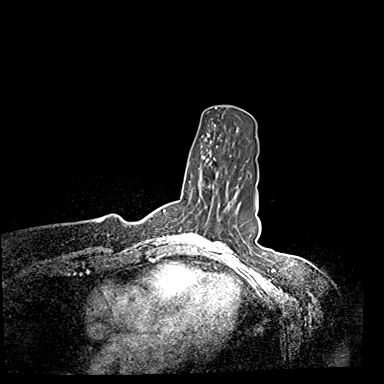
[im 96/144]
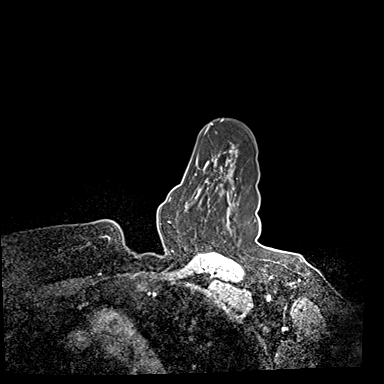
[im 144/144]
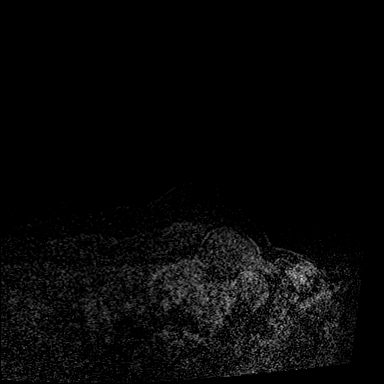

[Series 7: dynamic post 3 · axial · 1.3mm · 0.73mm/px · z∈[-86,+100]mm · 4 of 144 slices shown (2 of 2)]
[im 1/144]
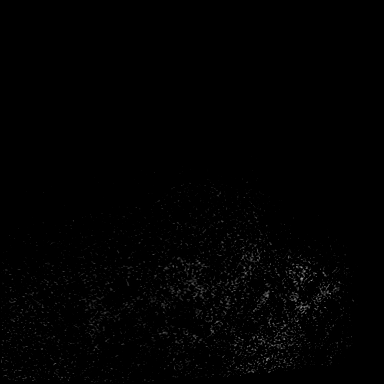
[im 48/144]
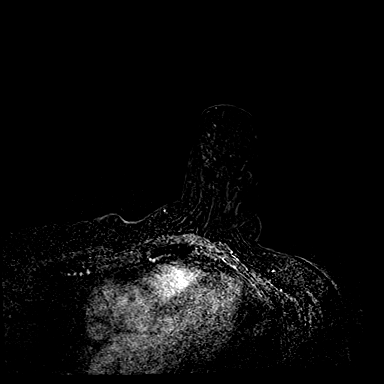
[im 96/144]
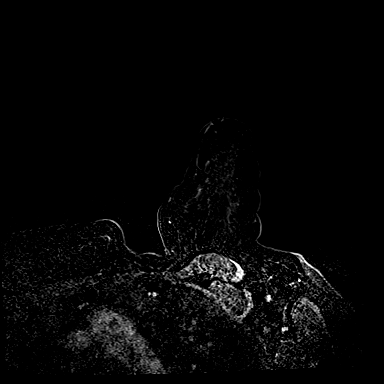
[im 144/144]
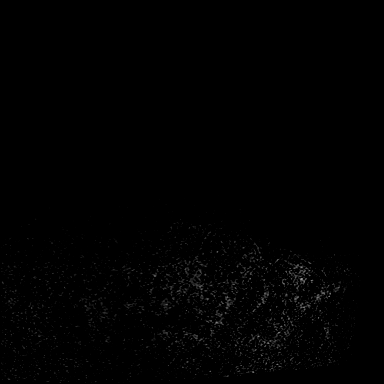

[Series 8: needle confirmation · axial · 1.3mm · 0.73mm/px · z∈[-86,+100]mm · 4 of 144 slices shown]
[im 1/144]
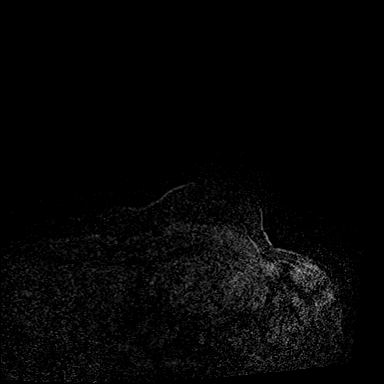
[im 48/144]
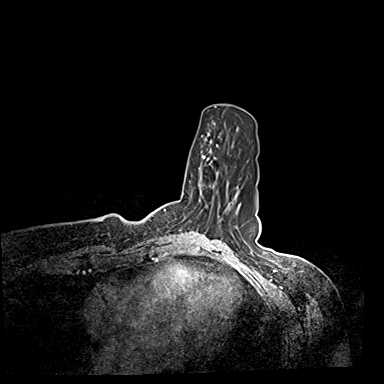
[im 96/144]
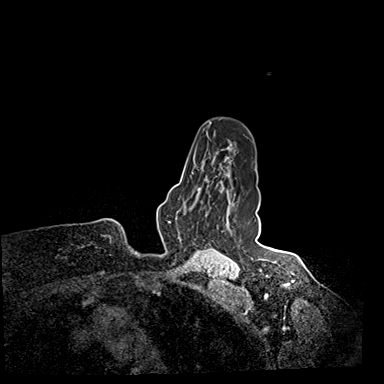
[im 144/144]
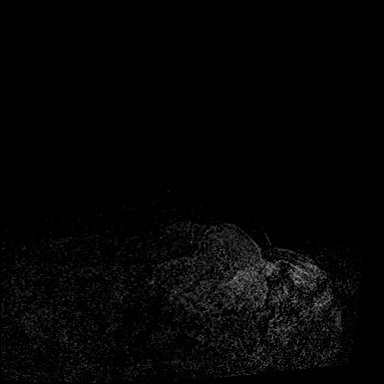

[Series 9: needle confirmation_sub · axial · 1.3mm · 0.73mm/px · z∈[-86,+100]mm · 4 of 144 slices shown]
[im 1/144]
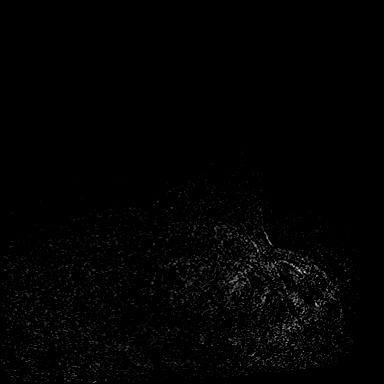
[im 48/144]
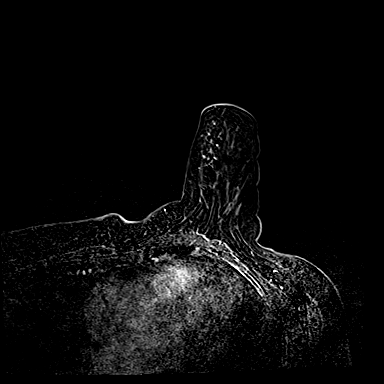
[im 96/144]
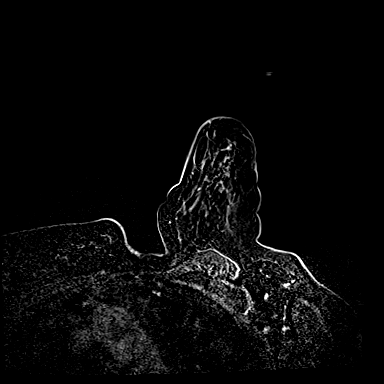
[im 144/144]
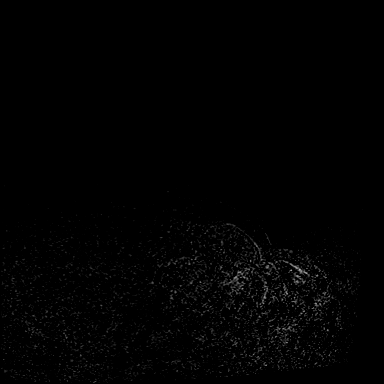

[Series 10: post bx · axial · 1.3mm · 0.73mm/px · 1 of 144 slices shown]
[im 1/144]
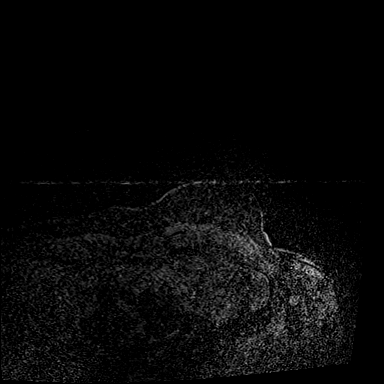

[33 of 48 positions shown; findings below may reference images not displayed]

FINDINGS: I met with the patient, and we discussed the procedure of MRI guided
biopsy, including risks, benefits, and alternatives. Specifically,
we discussed the risks of infection, bleeding, tissue injury, clip
migration, and inadequate sampling. Informed, written consent was
given. The usual time out protocol was performed immediately prior
to the procedure.

Using sterile technique, 1% Lidocaine, MRI guidance, and a 9 gauge
vacuum assisted device, biopsy was performed of a mass in the
upper-inner quadrant of the left breast using a lateral to medial
approach. At the conclusion of the procedure, a tissue marker clip
was deployed into the biopsy cavity. Follow-up 2-view mammogram was
performed and dictated separately.
IMPRESSION: MRI guided biopsy of the left breast.  No apparent complications.

ADDENDUM:
Pathology revealed USUAL DUCTAL AND COLUMNAR CELL HYPERPLASIA,
FIBROCYSTIC CHANGES INCLUDING APOCRINE METAPLASIA AND CALCIFICATIONS
of the LEFT breast, upper inner quadrant (dumbbell clip). This was
found to be concordant by Dr. DURR.

Pathology results were discussed with the patient by telephone. The
patient reported doing well after the biopsy with tenderness at the
site. Post biopsy instructions and care were reviewed and questions
were answered. The patient was encouraged to call The [REDACTED]

Breast MRI recommended in 6 months- per protocol to follow LEFT
side.

The patient has a recent diagnosis of RIGHT breast cancer and should
follow her outlined treatment plan.

Pathology results reported by DURR RN on [DATE].

*** End of Addendum ***
FINDINGS: I met with the patient, and we discussed the procedure of MRI guided
biopsy, including risks, benefits, and alternatives. Specifically,
we discussed the risks of infection, bleeding, tissue injury, clip
migration, and inadequate sampling. Informed, written consent was
given. The usual time out protocol was performed immediately prior
to the procedure.

Using sterile technique, 1% Lidocaine, MRI guidance, and a 9 gauge
vacuum assisted device, biopsy was performed of a mass in the
upper-inner quadrant of the left breast using a lateral to medial
approach. At the conclusion of the procedure, a tissue marker clip
was deployed into the biopsy cavity. Follow-up 2-view mammogram was
performed and dictated separately.
IMPRESSION: MRI guided biopsy of the left breast.  No apparent complications.

## 2021-10-16 SURGERY — BREAST LUMPECTOMY WITH RADIOACTIVE SEED AND SENTINEL LYMPH NODE BIOPSY
Anesthesia: General | Site: Breast | Laterality: Right

## 2021-10-16 MED ORDER — GADOBUTROL 1 MMOL/ML IV SOLN
6.0000 mL | Freq: Once | INTRAVENOUS | Status: AC | PRN
  Administered 2021-10-16: 6 mL via INTRAVENOUS

## 2021-10-21 ENCOUNTER — Encounter: Payer: Self-pay | Admitting: *Deleted

## 2021-10-22 ENCOUNTER — Encounter: Payer: Self-pay | Admitting: *Deleted

## 2021-10-23 ENCOUNTER — Other Ambulatory Visit: Payer: Self-pay

## 2021-10-23 ENCOUNTER — Encounter (HOSPITAL_BASED_OUTPATIENT_CLINIC_OR_DEPARTMENT_OTHER): Payer: Self-pay | Admitting: General Surgery

## 2021-10-30 ENCOUNTER — Ambulatory Visit
Admission: RE | Admit: 2021-10-30 | Discharge: 2021-10-30 | Disposition: A | Discharge status: Home / Self Care | Payer: Medicare Other | Source: Ambulatory Visit | Attending: General Surgery | Admitting: General Surgery

## 2021-10-30 DIAGNOSIS — Z170 Estrogen receptor positive status [ER+]: Secondary | ICD-10-CM

## 2021-10-30 DIAGNOSIS — C50211 Malignant neoplasm of upper-inner quadrant of right female breast: Secondary | ICD-10-CM

## 2021-10-30 IMAGING — MG MM PLC BREAST LOC DEV 1ST LESION INC*R*
8 of 14 series · 8 of 14 positions shown · non-contrast
Comparison: Previous exam(s).

CLINICAL DATA: 70-year-old female with newly diagnosed right breast
invasive mammary carcinoma. Patient presents for localization prior
to lumpectomy.

EXAM:
MAMMOGRAPHIC GUIDED RADIOACTIVE SEED LOCALIZATION OF THE RIGHT
BREAST

[R ML (1 of 3)]
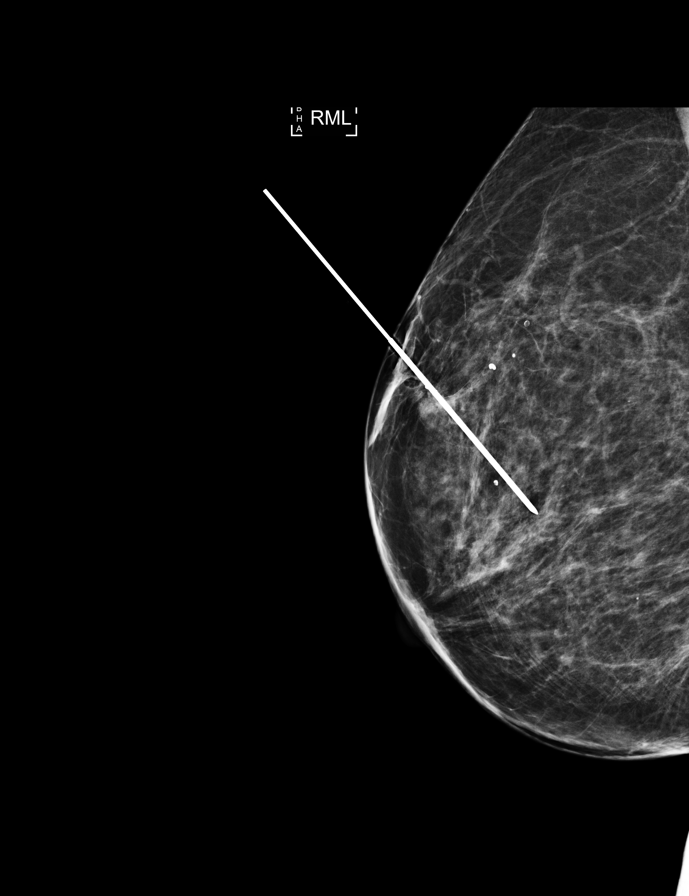

[R LM (1 of 3)]
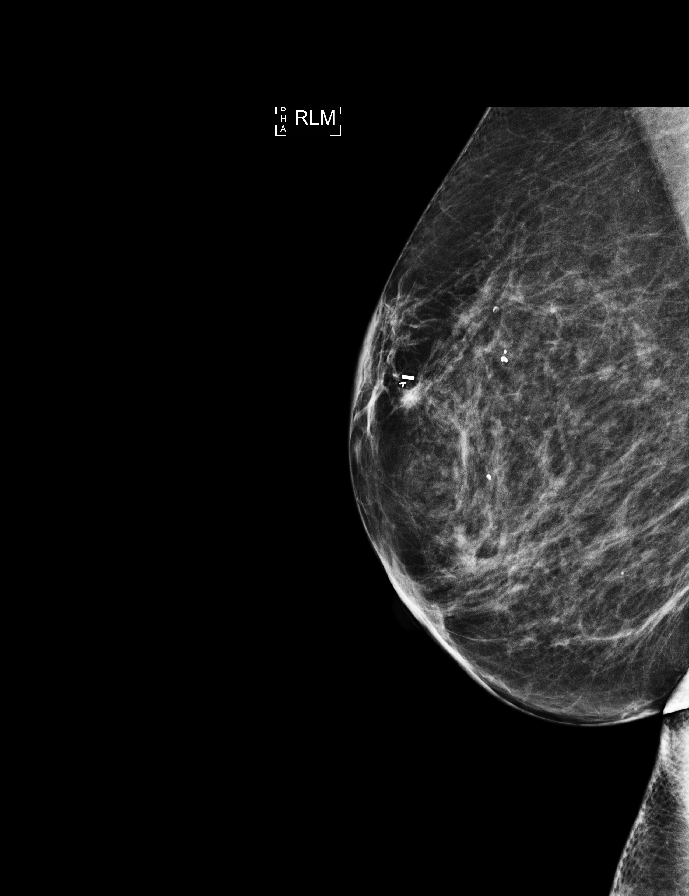

[R ML (2 of 3)]
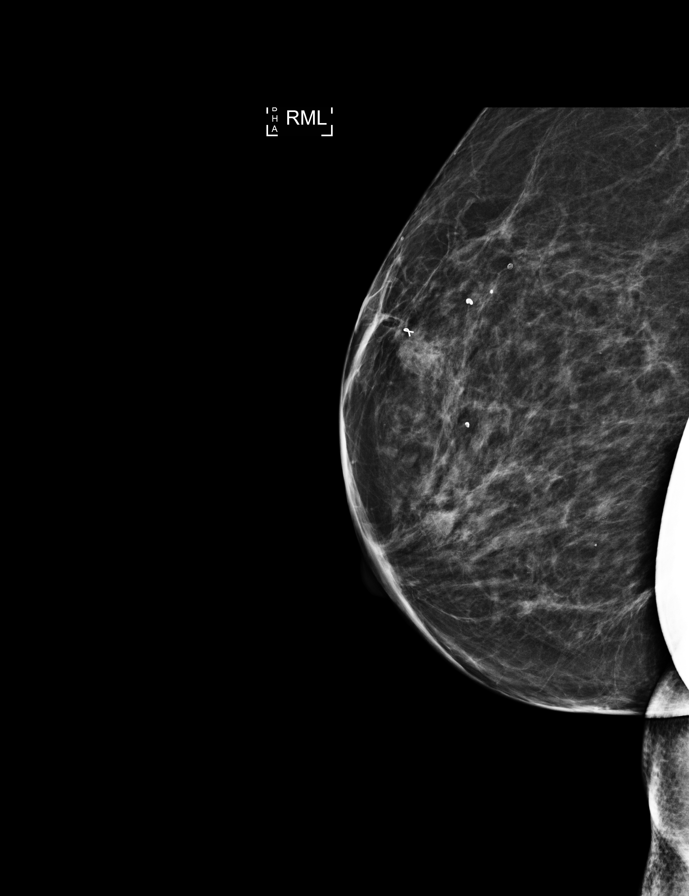

[R LM (2 of 3)]
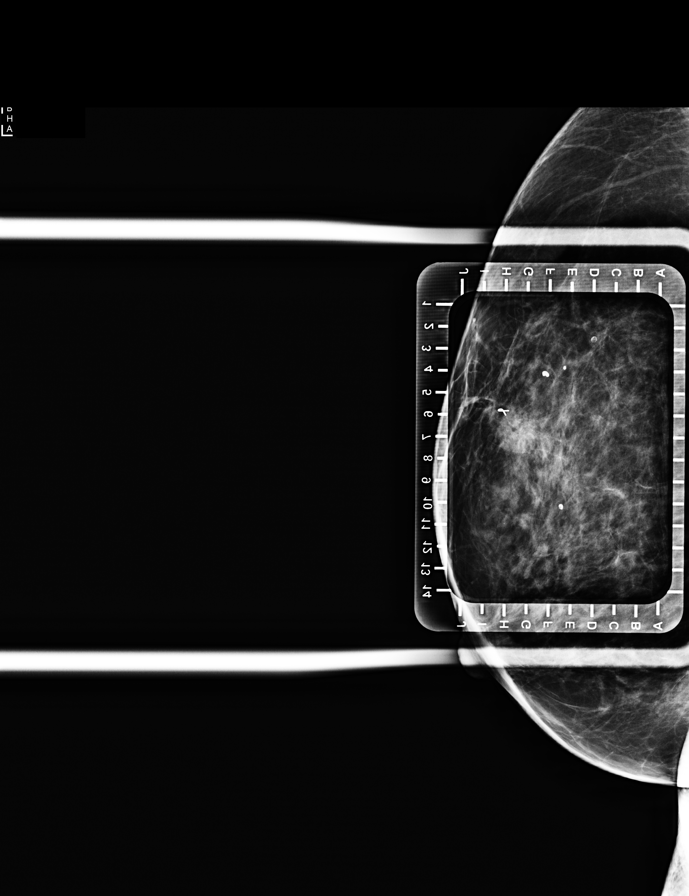

[R CC (1 of 2)]
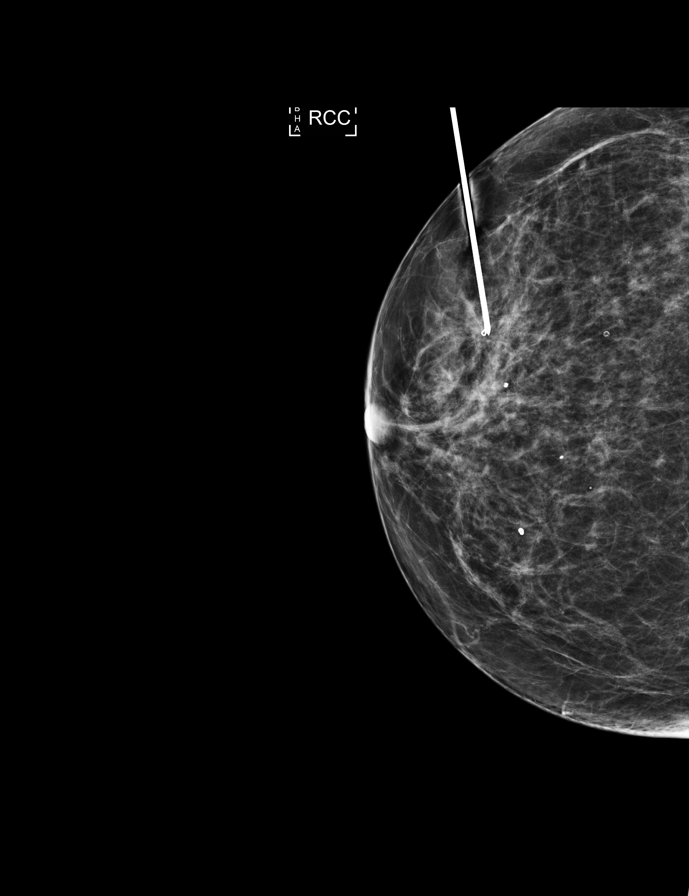

[R LM (3 of 3)]
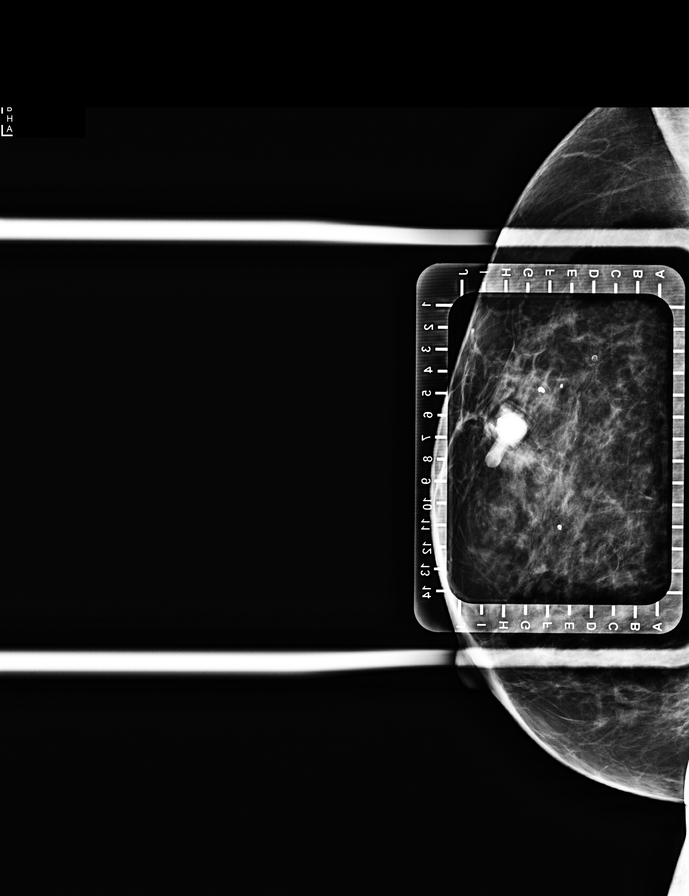

[R ML (3 of 3)]
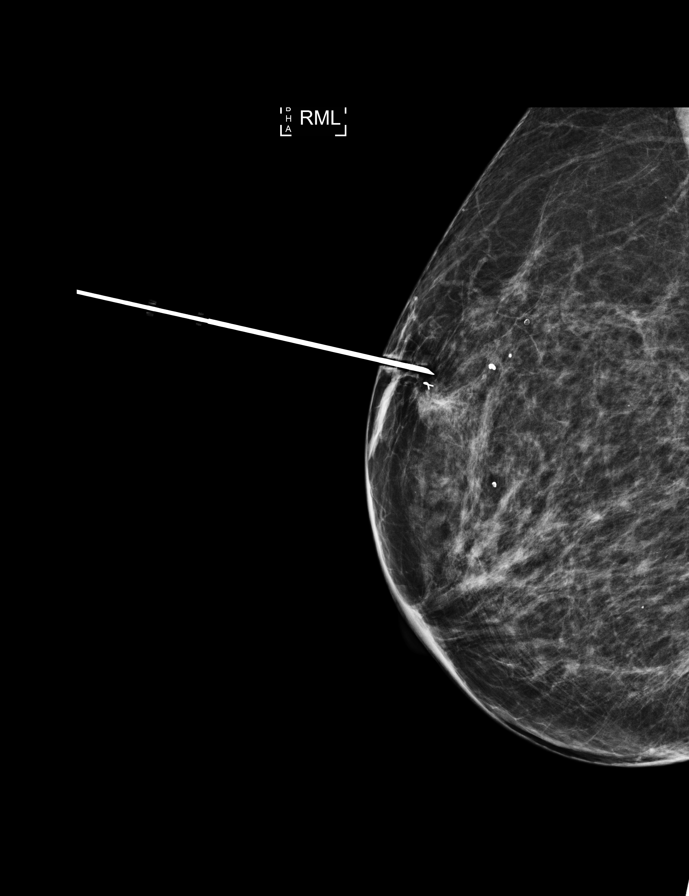

[R CC (2 of 2)]
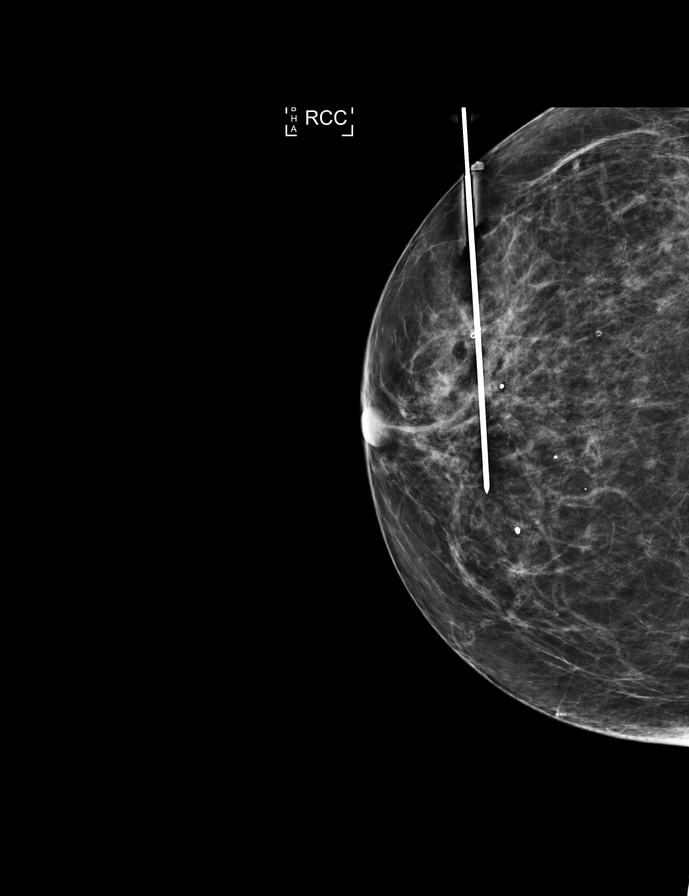

[8 of 14 positions shown; findings below may reference images not displayed]

FINDINGS: Patient presents for radioactive seed localization prior to right
breast lumpectomy. I met with the patient and we discussed the
procedure of seed localization including benefits and alternatives.
We discussed the high likelihood of a successful procedure. We
discussed the risks of the procedure including infection, bleeding,
tissue injury and further surgery. We discussed the low dose of
radioactivity involved in the procedure. Informed, written consent
was given.

The usual time-out protocol was performed immediately prior to the
procedure.

Using mammographic guidance, sterile technique, 1% lidocaine and an
[GN] radioactive seed, the ribbon shaped biopsy marking clip was
localized using a lateral approach. The follow-up mammogram images
confirm the seed in the expected location and were marked for Dr.
REITEL.

Follow-up survey of the patient confirms presence of the radioactive
seed.

Order number of [GN] seed:  [PHONE_NUMBER].

Total activity: 0.251 mCi reference Date: [DATE]

The patient tolerated the procedure well and was released from the
[REDACTED]. She was given instructions regarding seed removal.
IMPRESSION: Radioactive seed localization right breast. No apparent
complications.

## 2021-10-30 NOTE — Progress Notes (Signed)

## 2021-10-31 ENCOUNTER — Encounter (HOSPITAL_BASED_OUTPATIENT_CLINIC_OR_DEPARTMENT_OTHER): Payer: Self-pay | Admitting: General Surgery

## 2021-10-31 ENCOUNTER — Encounter (HOSPITAL_BASED_OUTPATIENT_CLINIC_OR_DEPARTMENT_OTHER)
Admission: RE | Disposition: A | Discharge status: Home / Self Care | Payer: Self-pay | Source: Home / Self Care | Attending: General Surgery

## 2021-10-31 ENCOUNTER — Other Ambulatory Visit: Payer: Self-pay

## 2021-10-31 ENCOUNTER — Ambulatory Visit (HOSPITAL_BASED_OUTPATIENT_CLINIC_OR_DEPARTMENT_OTHER)
Admission: RE | Admit: 2021-10-31 | Discharge: 2021-10-31 | Disposition: A | Discharge status: Home / Self Care | Payer: Medicare Other | Attending: General Surgery | Admitting: General Surgery

## 2021-10-31 ENCOUNTER — Ambulatory Visit
Admission: RE | Admit: 2021-10-31 | Discharge: 2021-10-31 | Disposition: A | Discharge status: Home / Self Care | Payer: Medicare Other | Source: Ambulatory Visit | Attending: General Surgery | Admitting: General Surgery

## 2021-10-31 ENCOUNTER — Ambulatory Visit (HOSPITAL_BASED_OUTPATIENT_CLINIC_OR_DEPARTMENT_OTHER): Payer: Medicare Other | Admitting: Anesthesiology

## 2021-10-31 DIAGNOSIS — I10 Essential (primary) hypertension: Secondary | ICD-10-CM | POA: Insufficient documentation

## 2021-10-31 DIAGNOSIS — C50211 Malignant neoplasm of upper-inner quadrant of right female breast: Secondary | ICD-10-CM

## 2021-10-31 DIAGNOSIS — C50411 Malignant neoplasm of upper-outer quadrant of right female breast: Secondary | ICD-10-CM | POA: Diagnosis not present

## 2021-10-31 DIAGNOSIS — M1990 Unspecified osteoarthritis, unspecified site: Secondary | ICD-10-CM

## 2021-10-31 DIAGNOSIS — E785 Hyperlipidemia, unspecified: Secondary | ICD-10-CM | POA: Insufficient documentation

## 2021-10-31 DIAGNOSIS — Z170 Estrogen receptor positive status [ER+]: Secondary | ICD-10-CM | POA: Insufficient documentation

## 2021-10-31 DIAGNOSIS — Z87891 Personal history of nicotine dependence: Secondary | ICD-10-CM | POA: Insufficient documentation

## 2021-10-31 DIAGNOSIS — Z79899 Other long term (current) drug therapy: Secondary | ICD-10-CM | POA: Diagnosis not present

## 2021-10-31 DIAGNOSIS — C50911 Malignant neoplasm of unspecified site of right female breast: Secondary | ICD-10-CM

## 2021-10-31 HISTORY — PX: BREAST LUMPECTOMY WITH RADIOACTIVE SEED AND SENTINEL LYMPH NODE BIOPSY: SHX6550

## 2021-10-31 IMAGING — MG MM BREAST SURGICAL SPECIMEN
1 series · 1 of 1 positions shown · non-contrast
Comparison: Previous exam(s).

CLINICAL DATA: 70-year-old with a biopsy-proven invasive ductal
carcinoma involving the UPPER OUTER QUADRANT of the RIGHT breast.
Radioactive seed localization performed yesterday in anticipation of
today's lumpectomy.

EXAM:
SPECIMEN RADIOGRAPH OF THE RIGHT BREAST

[R]
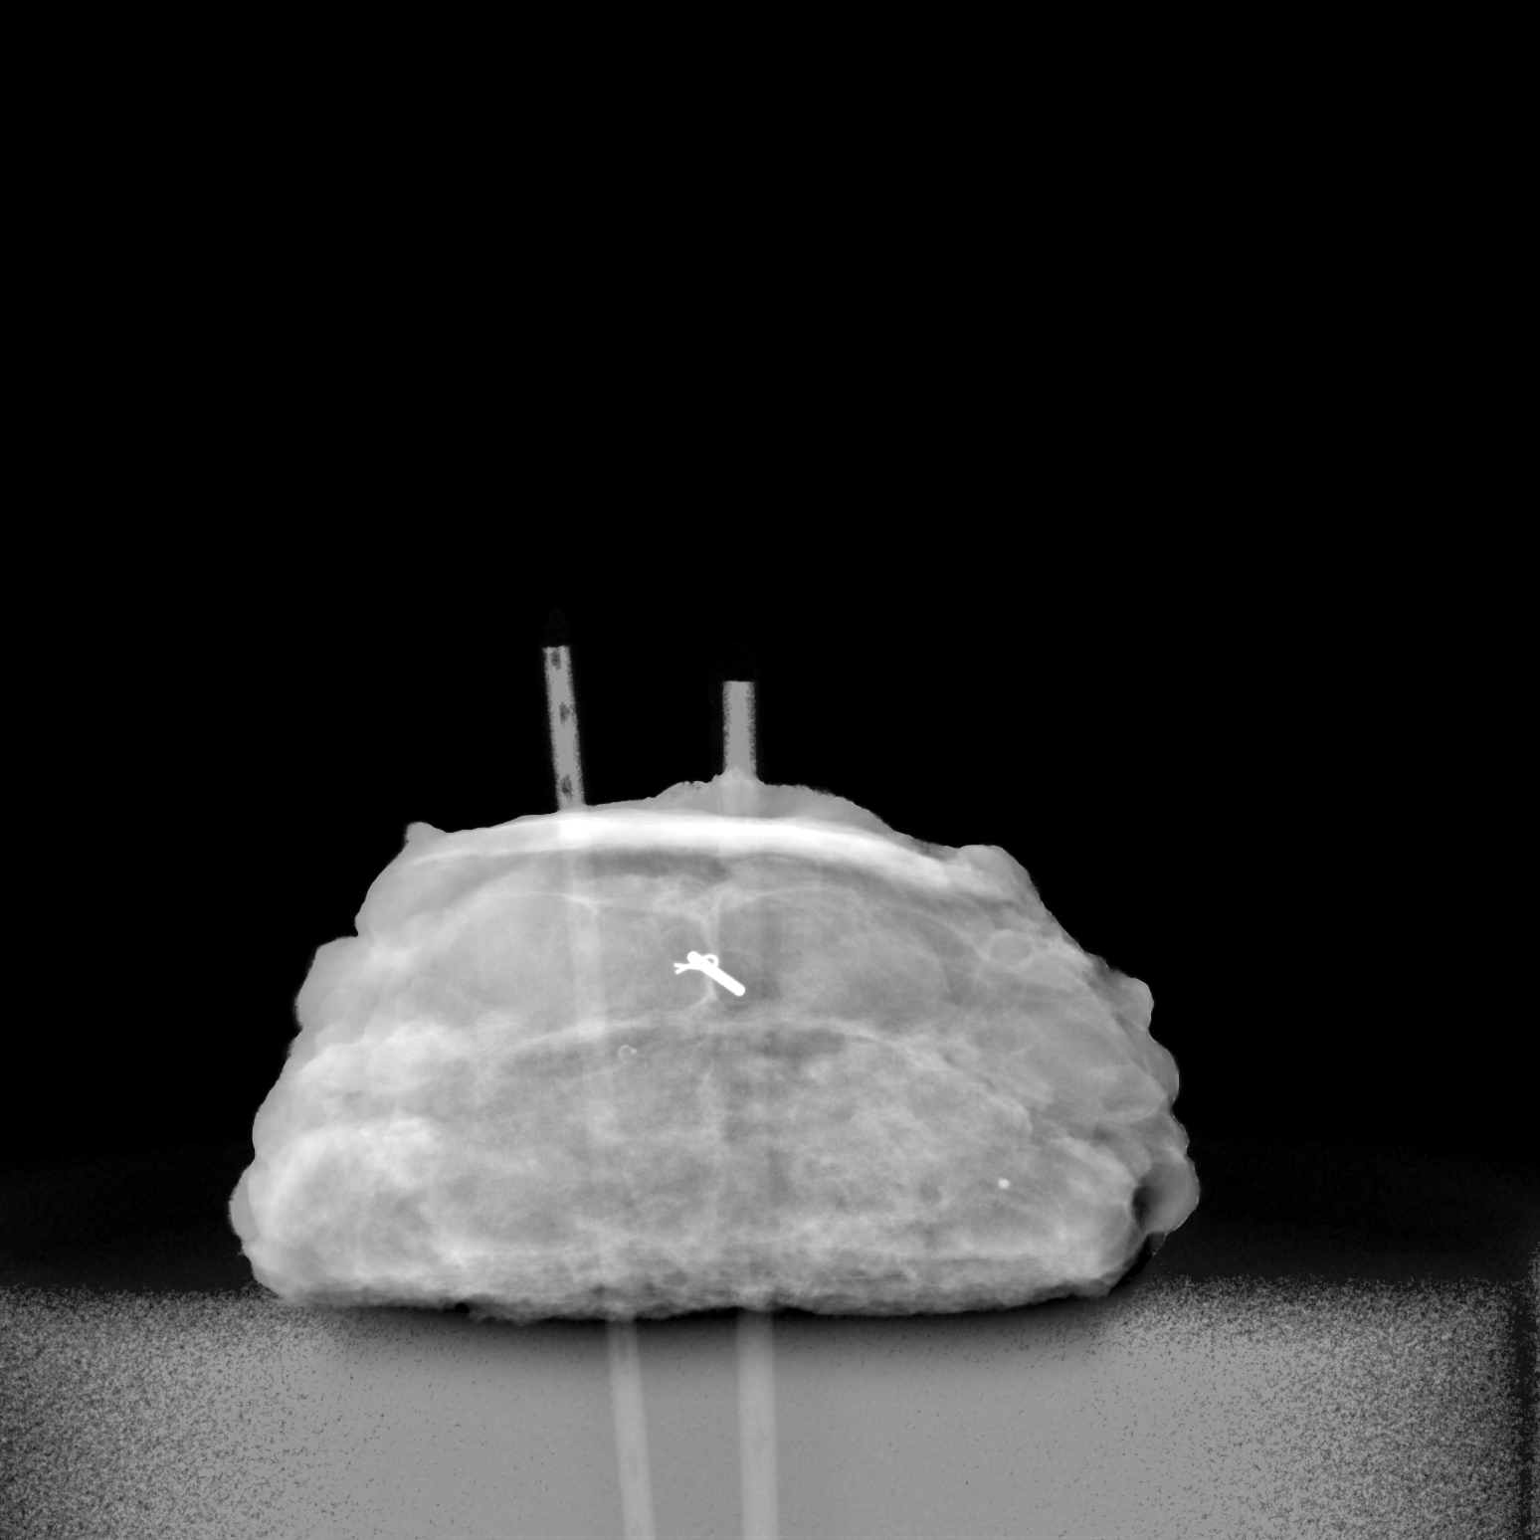

[1 of 1 positions shown; findings below may reference images not displayed]

FINDINGS: Status post excision of the RIGHT breast. The radioactive seed and
the ribbon shaped biopsy marker clip are present scratch though in
the non-compressed specimen. The seed is intact. This was discussed
with the operating room nurse by telephone on [DATE] at [DATE]
p.m.
IMPRESSION: Specimen radiograph of the RIGHT breast.

## 2021-10-31 SURGERY — BREAST LUMPECTOMY WITH RADIOACTIVE SEED AND SENTINEL LYMPH NODE BIOPSY
Anesthesia: General | Site: Breast | Laterality: Right

## 2021-10-31 MED ORDER — MIDAZOLAM HCL 2 MG/2ML IJ SOLN
2.0000 mg | Freq: Once | INTRAMUSCULAR | Status: AC
  Administered 2021-10-31: 2 mg via INTRAVENOUS

## 2021-10-31 MED ORDER — CELECOXIB 200 MG PO CAPS
ORAL_CAPSULE | ORAL | Status: AC
  Filled 2021-10-31: qty 1

## 2021-10-31 MED ORDER — FENTANYL CITRATE (PF) 100 MCG/2ML IJ SOLN
50.0000 ug | Freq: Once | INTRAMUSCULAR | Status: AC
  Administered 2021-10-31: 50 ug via INTRAVENOUS

## 2021-10-31 MED ORDER — CEFAZOLIN SODIUM-DEXTROSE 2-3 GM-%(50ML) IV SOLR
INTRAVENOUS | Status: DC | PRN
  Administered 2021-10-31: 2 g via INTRAVENOUS

## 2021-10-31 MED ORDER — MIDAZOLAM HCL 2 MG/2ML IJ SOLN
INTRAMUSCULAR | Status: AC
  Filled 2021-10-31: qty 2

## 2021-10-31 MED ORDER — ACETAMINOPHEN 500 MG PO TABS
ORAL_TABLET | ORAL | Status: AC
  Filled 2021-10-31: qty 2

## 2021-10-31 MED ORDER — CEFAZOLIN SODIUM-DEXTROSE 2-4 GM/100ML-% IV SOLN: 2.0000 g | INTRAVENOUS | Status: DC

## 2021-10-31 MED ORDER — GABAPENTIN 300 MG PO CAPS
ORAL_CAPSULE | ORAL | Status: AC
  Filled 2021-10-31: qty 1

## 2021-10-31 MED ORDER — TRAMADOL HCL 50 MG PO TABS: 50.0000 mg | ORAL_TABLET | Freq: Four times a day (QID) | ORAL | 0 refills | Status: DC | PRN

## 2021-10-31 MED ORDER — HYDROMORPHONE HCL 1 MG/ML IJ SOLN
0.2500 mg | INTRAMUSCULAR | Status: DC | PRN
  Administered 2021-10-31 (×2): 0.25 mg via INTRAVENOUS

## 2021-10-31 MED ORDER — LIDOCAINE 2% (20 MG/ML) 5 ML SYRINGE
INTRAMUSCULAR | Status: AC
  Filled 2021-10-31: qty 5

## 2021-10-31 MED ORDER — MEPERIDINE HCL 25 MG/ML IJ SOLN: 6.2500 mg | INTRAMUSCULAR | Status: DC | PRN

## 2021-10-31 MED ORDER — FENTANYL CITRATE (PF) 100 MCG/2ML IJ SOLN
INTRAMUSCULAR | Status: AC
  Filled 2021-10-31: qty 2

## 2021-10-31 MED ORDER — CHLORHEXIDINE GLUCONATE CLOTH 2 % EX PADS: 6.0000 | MEDICATED_PAD | Freq: Once | CUTANEOUS | Status: DC

## 2021-10-31 MED ORDER — HYDROCODONE-ACETAMINOPHEN 5-325 MG PO TABS: 1.0000 | ORAL_TABLET | Freq: Four times a day (QID) | ORAL | 0 refills | Status: DC | PRN

## 2021-10-31 MED ORDER — MAGTRACE LYMPHATIC TRACER
INTRAMUSCULAR | Status: DC | PRN
  Administered 2021-10-31: 2 mL via INTRAMUSCULAR

## 2021-10-31 MED ORDER — ONDANSETRON HCL 4 MG/2ML IJ SOLN
INTRAMUSCULAR | Status: AC
  Filled 2021-10-31: qty 2

## 2021-10-31 MED ORDER — PROPOFOL 10 MG/ML IV BOLUS
INTRAVENOUS | Status: AC
  Filled 2021-10-31: qty 20

## 2021-10-31 MED ORDER — ROPIVACAINE HCL 5 MG/ML IJ SOLN
INTRAMUSCULAR | Status: DC | PRN
  Administered 2021-10-31: 30 mL via PERINEURAL

## 2021-10-31 MED ORDER — GABAPENTIN 300 MG PO CAPS
300.0000 mg | ORAL_CAPSULE | ORAL | Status: AC
  Administered 2021-10-31: 300 mg via ORAL

## 2021-10-31 MED ORDER — AMISULPRIDE (ANTIEMETIC) 5 MG/2ML IV SOLN: 10.0000 mg | Freq: Once | INTRAVENOUS | Status: DC | PRN

## 2021-10-31 MED ORDER — LACTATED RINGERS IV SOLN: INTRAVENOUS | Status: DC | PRN

## 2021-10-31 MED ORDER — OXYCODONE HCL 5 MG PO TABS: 5.0000 mg | ORAL_TABLET | Freq: Once | ORAL | Status: DC | PRN

## 2021-10-31 MED ORDER — LIDOCAINE HCL (CARDIAC) PF 100 MG/5ML IV SOSY
PREFILLED_SYRINGE | INTRAVENOUS | Status: DC | PRN
  Administered 2021-10-31: 100 mg via INTRAVENOUS

## 2021-10-31 MED ORDER — BUPIVACAINE-EPINEPHRINE 0.25% -1:200000 IJ SOLN
INTRAMUSCULAR | Status: DC | PRN
  Administered 2021-10-31: 29 mL

## 2021-10-31 MED ORDER — PROPOFOL 10 MG/ML IV BOLUS
INTRAVENOUS | Status: DC | PRN
Start: 2021-10-31 — End: 2021-10-31
  Administered 2021-10-31: 200 mg via INTRAVENOUS

## 2021-10-31 MED ORDER — CEFAZOLIN SODIUM-DEXTROSE 2-4 GM/100ML-% IV SOLN
INTRAVENOUS | Status: AC
  Filled 2021-10-31: qty 100

## 2021-10-31 MED ORDER — LACTATED RINGERS IV SOLN: INTRAVENOUS | Status: DC

## 2021-10-31 MED ORDER — PROMETHAZINE HCL 25 MG/ML IJ SOLN: 6.2500 mg | INTRAMUSCULAR | Status: DC | PRN

## 2021-10-31 MED ORDER — OXYCODONE HCL 5 MG/5ML PO SOLN: 5.0000 mg | Freq: Once | ORAL | Status: DC | PRN

## 2021-10-31 MED ORDER — ONDANSETRON HCL 4 MG/2ML IJ SOLN
INTRAMUSCULAR | Status: DC | PRN
  Administered 2021-10-31: 4 mg via INTRAVENOUS

## 2021-10-31 MED ORDER — DEXAMETHASONE SODIUM PHOSPHATE 10 MG/ML IJ SOLN
INTRAMUSCULAR | Status: AC
  Filled 2021-10-31: qty 1

## 2021-10-31 MED ORDER — HYDROMORPHONE HCL 1 MG/ML IJ SOLN
INTRAMUSCULAR | Status: AC
  Filled 2021-10-31: qty 0.5

## 2021-10-31 MED ORDER — DEXAMETHASONE SODIUM PHOSPHATE 10 MG/ML IJ SOLN
INTRAMUSCULAR | Status: DC | PRN
  Administered 2021-10-31: 5 mg via INTRAVENOUS

## 2021-10-31 MED ORDER — ACETAMINOPHEN 500 MG PO TABS
1000.0000 mg | ORAL_TABLET | ORAL | Status: AC
  Administered 2021-10-31: 1000 mg via ORAL

## 2021-10-31 MED ORDER — CELECOXIB 200 MG PO CAPS
200.0000 mg | ORAL_CAPSULE | ORAL | Status: AC
  Administered 2021-10-31: 200 mg via ORAL

## 2021-10-31 SURGICAL SUPPLY — 48 items
ADH SKN CLS APL DERMABOND .7 (GAUZE/BANDAGES/DRESSINGS) ×1
APL PRP STRL LF DISP 70% ISPRP (MISCELLANEOUS) ×1
APPLIER CLIP 9.375 MED OPEN (MISCELLANEOUS) ×2
APR CLP MED 9.3 20 MLT OPN (MISCELLANEOUS) ×1
BINDER BREAST XLRG (GAUZE/BANDAGES/DRESSINGS) ×1 IMPLANT
BLADE SURG 15 STRL LF DISP TIS (BLADE) ×1 IMPLANT
BLADE SURG 15 STRL SS (BLADE) ×2
CANISTER SUC SOCK COL 7IN (MISCELLANEOUS) IMPLANT
CANISTER SUCT 1200ML W/VALVE (MISCELLANEOUS) IMPLANT
CHLORAPREP W/TINT 26 (MISCELLANEOUS) ×2 IMPLANT
CLIP APPLIE 9.375 MED OPEN (MISCELLANEOUS) ×1 IMPLANT
COVER BACK TABLE 60X90IN (DRAPES) ×2 IMPLANT
COVER MAYO STAND STRL (DRAPES) ×2 IMPLANT
COVER PROBE W GEL 5X96 (DRAPES) ×3 IMPLANT
DERMABOND ADVANCED (GAUZE/BANDAGES/DRESSINGS) ×1
DERMABOND ADVANCED .7 DNX12 (GAUZE/BANDAGES/DRESSINGS) ×1 IMPLANT
DRAPE LAPAROSCOPIC ABDOMINAL (DRAPES) ×2 IMPLANT
DRAPE UTILITY XL STRL (DRAPES) ×2 IMPLANT
ELECT COATED BLADE 2.86 ST (ELECTRODE) ×2 IMPLANT
ELECT REM PT RETURN 9FT ADLT (ELECTROSURGICAL) ×2
ELECTRODE REM PT RTRN 9FT ADLT (ELECTROSURGICAL) ×1 IMPLANT
GLOVE SURG ENC MOIS LTX SZ7.5 (GLOVE) ×2 IMPLANT
GLOVE SURG POLYISO LF SZ6.5 (GLOVE) ×1 IMPLANT
GLOVE SURG UNDER POLY LF SZ7 (GLOVE) ×1 IMPLANT
GOWN STRL REUS W/ TWL LRG LVL3 (GOWN DISPOSABLE) ×2 IMPLANT
GOWN STRL REUS W/TWL LRG LVL3 (GOWN DISPOSABLE) ×4
ILLUMINATOR WAVEGUIDE N/F (MISCELLANEOUS) IMPLANT
KIT MARKER MARGIN INK (KITS) ×2 IMPLANT
LIGHT WAVEGUIDE WIDE FLAT (MISCELLANEOUS) IMPLANT
NDL HYPO 25X1 1.5 SAFETY (NEEDLE) ×1 IMPLANT
NDL SAFETY ECLIPSE 18X1.5 (NEEDLE) IMPLANT
NEEDLE HYPO 18GX1.5 SHARP (NEEDLE)
NEEDLE HYPO 25X1 1.5 SAFETY (NEEDLE) ×2 IMPLANT
NS IRRIG 1000ML POUR BTL (IV SOLUTION) ×1 IMPLANT
PACK BASIN DAY SURGERY FS (CUSTOM PROCEDURE TRAY) ×2 IMPLANT
PENCIL SMOKE EVACUATOR (MISCELLANEOUS) ×2 IMPLANT
SLEEVE SCD COMPRESS KNEE MED (STOCKING) ×2 IMPLANT
SPIKE FLUID TRANSFER (MISCELLANEOUS) IMPLANT
SPONGE T-LAP 18X18 ~~LOC~~+RFID (SPONGE) ×2 IMPLANT
SUT MON AB 4-0 PC3 18 (SUTURE) ×4 IMPLANT
SUT SILK 2 0 SH (SUTURE) IMPLANT
SUT VICRYL 3-0 CR8 SH (SUTURE) ×2 IMPLANT
SYR CONTROL 10ML LL (SYRINGE) ×2 IMPLANT
TOWEL GREEN STERILE FF (TOWEL DISPOSABLE) ×2 IMPLANT
TRACER MAGTRACE VIAL (MISCELLANEOUS) ×1 IMPLANT
TRAY FAXITRON CT DISP (TRAY / TRAY PROCEDURE) ×2 IMPLANT
TUBE CONNECTING 20X1/4 (TUBING) ×1 IMPLANT
YANKAUER SUCT BULB TIP NO VENT (SUCTIONS) ×1 IMPLANT

## 2021-10-31 NOTE — Op Note (Signed)
10/31/2021  12:46 PM  PATIENT:  Maria Love  71 y.o. female  PRE-OPERATIVE DIAGNOSIS:  RIGHT BREAST CANCER  POST-OPERATIVE DIAGNOSIS:  RIGHT BREAST CANCER  PROCEDURE:  Procedure(s): RIGHT BREAST LUMPECTOMY WITH RADIOACTIVE SEED LOCALIZATION AND DEEP RIGHT AXILLARY SENTINEL LYMPH NODE BIOPSY (Right)  SURGEON:  Surgeon(s) and Role:    * Jovita Kussmaul, MD - Primary  PHYSICIAN ASSISTANT:   ASSISTANTS: none   ANESTHESIA:   local and general  EBL:  minimal   BLOOD ADMINISTERED:none  DRAINS: none   LOCAL MEDICATIONS USED:  MARCAINE     SPECIMEN:  Source of Specimen:  right breast tissue and sentinel node  DISPOSITION OF SPECIMEN:  PATHOLOGY  COUNTS:  YES  TOURNIQUET:  * No tourniquets in log *  DICTATION: .Dragon Dictation  After informed consent was obtained the patient was brought to the operating room and placed in the supine position on the operating table.  After adequate induction of general anesthesia the patient's right chest, breast, and axillary area were prepped with ChloraPrep, allowed to dry, and draped in usual sterile manner.  An appropriate timeout was performed.  At this point, 2 cc of iron oxide were injected into the subareolar plexus of the right breast and the breast was massaged for about 5 minutes.  Also previously an I-125 seed was placed in the upper outer quadrant of the right breast to mark an area of invasive breast cancer.  Attention was first turned to the right axilla.  The mag trace was used to identify a signal in the right axilla.  This area was infiltrated with quarter percent Marcaine.  A transversely oriented incision was made with a 15 blade knife overlying the area of radioactivity.  The incision was carried through the skin and subcutaneous tissue sharply with the electrocautery until the deep right axillary space was entered.  I was able to identify 1 area of increased activity.  This area was excised sharply with the electrocautery  and the surrounding small vessels and lymphatics were controlled with clips.  No other hot or palpable lymph nodes were identified in the right axilla.  This node was sent as sentinel node #1.  Hemostasis was achieved using the Bovie electrocautery.  The deep layer of the incision was closed with interrupted 3-0 Vicryl stitches.  The skin was then closed with a running 4-0 Monocryl subcuticular stitch.  Attention was then turned to the right breast.  The neoprobe was set to I-125 in the area of radioactivity was readily identified.  Because of the superficial nature of the seed I elected to make an elliptical incision in the skin overlying the area of radioactivity with a 15 blade knife.  The incision was carried through the skin and subcutaneous tissue sharply with the electrocautery.  Dissection was then carried widely around the radioactive seed while checking the area of radioactivity frequently.  This dissection was carried all the way to the chest wall.  Once the tissue was removed it was oriented with the appropriate paint colors.  A specimen radiograph was obtained that showed the clip and seed to be near the center of the specimen.  The specimen was then sent to pathology for further evaluation.  Hemostasis was achieved using the Bovie electrocautery.  The wound was irrigated with saline and infiltrated with more quarter percent Marcaine.  The cavity was marked with clips.  The deep layer of the wound was then closed with interrupted 3-0 Vicryl stitches.  The skin was closed  with a running 4-0 Monocryl subcuticular stitch.  Dermabond dressings were applied.  The patient tolerated the procedure well.  At the end of the case all needle sponge and instrument counts were correct.  The patient was then awakened and taken to recovery in stable condition.  PLAN OF CARE: Discharge to home after PACU  PATIENT DISPOSITION:  PACU - hemodynamically stable.   Delay start of Pharmacological VTE agent (>24hrs) due  to surgical blood loss or risk of bleeding: not applicable

## 2021-10-31 NOTE — Anesthesia Procedure Notes (Signed)
Anesthesia Regional Block: Pectoralis block   Pre-Anesthetic Checklist: , timeout performed,  Correct Patient, Correct Site, Correct Laterality,  Correct Procedure, Correct Position, site marked,  Risks and benefits discussed,  Surgical consent,  Pre-op evaluation,  At surgeon's request and post-op pain management  Laterality: Right  Prep: chloraprep       Needles:  Injection technique: Single-shot  Needle Type: Stimiplex     Needle Length: 9cm  Needle Gauge: 21     Additional Needles:   Procedures:,,,, ultrasound used (permanent image in chart),,    Narrative:  Start time: 10/31/2021 10:42 AM End time: 10/31/2021 10:47 AM Injection made incrementally with aspirations every 5 mL.  Performed by: Personally  Anesthesiologist: Lynda Rainwater, MD

## 2021-10-31 NOTE — Interval H&P Note (Signed)
History and Physical Interval Note:  10/31/2021 11:07 AM  Hillis Range  has presented today for surgery, with the diagnosis of RIGHT BREAST CANCER.  The various methods of treatment have been discussed with the patient and family. After consideration of risks, benefits and other options for treatment, the patient has consented to  Procedure(s): RIGHT BREAST LUMPECTOMY WITH RADIOACTIVE SEED AND SENTINEL LYMPH NODE BIOPSY (Right) as a surgical intervention.  The patient's history has been reviewed, patient examined, no change in status, stable for surgery.  I have reviewed the patient's chart and labs.  Questions were answered to the patient's satisfaction.     Autumn Messing III

## 2021-10-31 NOTE — H&P (Signed)
REFERRING PHYSICIAN: Marletta Lor*  PROVIDER: Landry Corporal, MD  MRN: [id number] DOB: 11-08-1950 Subjective   Chief Complaint: Breast Cancer   History of Present Illness: Maria Love is a 71 y.o. female who is seen today as an office consultation at the request of Dr. Karlton Lemon for evaluation of Breast Cancer .   We are asked to see the patient in consultation by Dr. Heath Gold to evaluate her for a new right breast cancer. The patient is a 71 year old white female who recently went for a routine screening mammogram. At that time she was found to have a 7 mm mass in the upper outer quadrant central right breast. The lymph nodes looked normal. 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 She has no family history of breast cancer. She is otherwise in pretty good shape medically and does not smoke.  Review of Systems: A complete review of systems was obtained from the patient. I have reviewed this information and discussed as appropriate with the patient. See HPI as well for other ROS.  ROS   Medical History: Past Medical History:  Diagnosis Date   Arthritis   History of cancer   Hyperlipidemia   Hypertension   Patient Active Problem List  Diagnosis   Malignant neoplasm of upper-outer quadrant of right breast in female, estrogen receptor positive (CMS-HCC)   Past Surgical History:  Procedure Laterality Date   left foot    Allergies  Allergen Reactions   Antihistamines - Alkylamine Other (See Comments)   Current Outpatient Medications on File Prior to Visit  Medication Sig Dispense Refill   aspirin 81 MG EC tablet Take by mouth   atorvastatin (LIPITOR) 40 MG tablet Take 1 tablet by mouth once daily   co-enzyme Q-10, ubiquinone, 100 mg capsule Take 1 capsule by mouth once daily   metoprolol succinate (TOPROL-XL) 25 MG XL tablet metoprolol succinate ER 25 mg  tablet,extended release 24 hr   vitamin D3/vitamin K2, MK4, (K2 PLUS D3 ORAL) Take by mouth   No current facility-administered medications on file prior to visit.   Family History  Problem Relation Age of Onset   Hyperlipidemia (Elevated cholesterol) Mother   High blood pressure (Hypertension) Mother   Skin cancer Father    Social History   Tobacco Use  Smoking Status Former   Types: Cigarettes   Quit date: 1971   Years since quitting: 52.0  Smokeless Tobacco Never    Social History   Socioeconomic History   Marital status: Single  Tobacco Use   Smoking status: Former  Types: Cigarettes  Quit date: 1971  Years since quitting: 52.0   Smokeless tobacco: Never  Substance and Sexual Activity   Alcohol use: Never   Drug use: Never   Objective:   Vitals:  BP: (!) 152/80  Pulse: 87  Weight: 76.3 kg (168 lb 3.2 oz)  Height: 167.6 cm (_0 )   Body mass index is 27.15 kg/m.  Physical Exam Vitals reviewed.  Constitutional:  General: She is not in acute distress. Appearance: Normal appearance.  HENT:  Head: Normocephalic and atraumatic.  Right Ear: External ear normal.  Left Ear: External ear normal.  Nose: Nose normal.  Mouth/Throat:  Mouth: Mucous membranes are moist.  Pharynx: Oropharynx is clear.  Eyes:  General: No scleral icterus. Extraocular Movements: Extraocular movements intact.  Conjunctiva/sclera: Conjunctivae normal.  Pupils: Pupils are  equal, round, and reactive to light.  Cardiovascular:  Rate and Rhythm: Normal rate and regular rhythm.  Pulses: Normal pulses.  Heart sounds: Normal heart sounds.  Pulmonary:  Effort: Pulmonary effort is normal. No respiratory distress.  Breath sounds: Normal breath sounds.  Abdominal:  General: Bowel sounds are normal.  Palpations: Abdomen is soft.  Tenderness: There is no abdominal tenderness.  Musculoskeletal:  General: No swelling, tenderness or deformity. Normal range of motion.  Cervical back:  Normal range of motion and neck supple.  Skin: General: Skin is warm and dry.  Coloration: Skin is not jaundiced.  Neurological:  General: No focal deficit present.  Mental Status: She is alert and oriented to person, place, and time.  Psychiatric:  Mood and Affect: Mood normal.  Behavior: Behavior normal.     Breast: There is a palpable bruise in the upper outer central right breast. Other than this there is no palpable mass in either breast. There is a single small mobile palpable lymph node in each axilla. There is no palpable cervical or supraclavicular lymphadenopathy.  Labs, Imaging and Diagnostic Testing:  Assessment and Plan:   Diagnoses and all orders for this visit:  Malignant neoplasm of upper-outer quadrant of right breast in female, estrogen receptor positive (CMS-HCC) - MRI breast bilateral with and without contrast; Future - Ambulatory Referral to Oncology-Medical - Ambulatory Referral to Physical Therapy - Ambulatory Referral to Radiation Oncology - [facility] Case Posting Request; Future    The patient appears to have a small stage I cancer in the upper outer quadrant of the right breast with clinically negative nodes. I have discussed with her in detail the different options for treatment and at this point she favors breast conservation which I feel is very reasonable. I have discussed with her in detail the risks and benefits of the operation as well as some of the technical aspects including the use of a radioactive seed for localization and she understands and wishes to proceed. She is also a good candidate for sentinel node mapping. I will refer her to medical and radiation oncology to discuss adjuvant therapy. She will also meet with physical therapy for preoperative lymphedema testing. We will obtain an MRI study given the lobular nature of the cancer and call her with the results. Otherwise we will begin surgical planning.

## 2021-10-31 NOTE — Transfer of Care (Signed)
Immediate Anesthesia Transfer of Care Note  Patient: Maria Love  Procedure(s) Performed: RIGHT BREAST LUMPECTOMY WITH RADIOACTIVE SEED AND SENTINEL LYMPH NODE BIOPSY (Right: Breast)  Patient Location: PACU  Anesthesia Type:General and regional  Level of Consciousness: awake, alert  and patient cooperative  Airway & Oxygen Therapy: Patient Spontanous Breathing and Patient connected to face mask oxygen  Post-op Assessment: Report given to RN and Post -op Vital signs reviewed and stable  Post vital signs: Reviewed and stable  Last Vitals:  Vitals Value Taken Time  BP 146/79 10/31/21 1251  Temp    Pulse 59 10/31/21 1253  Resp 12 10/31/21 1253  SpO2 100 % 10/31/21 1253  Vitals shown include unvalidated device data.  Last Pain:  Vitals:   10/31/21 0933  TempSrc: Oral  PainSc: 1       Patients Stated Pain Goal: 6 (91/98/02 2179)  Complications: No notable events documented.

## 2021-10-31 NOTE — Anesthesia Preprocedure Evaluation (Signed)
Anesthesia Evaluation  Patient identified by MRN, date of birth, ID band Patient awake    Reviewed: Allergy & Precautions, NPO status , Patient's Chart, lab work & pertinent test results  Airway Mallampati: II  TM Distance: >3 FB Neck ROM: Full    Dental no notable dental hx.    Pulmonary neg pulmonary ROS, former smoker,    Pulmonary exam normal breath sounds clear to auscultation       Cardiovascular hypertension, Pt. on medications negative cardio ROS Normal cardiovascular exam Rhythm:Regular Rate:Normal     Neuro/Psych negative neurological ROS  negative psych ROS   GI/Hepatic negative GI ROS, Neg liver ROS,   Endo/Other  negative endocrine ROS  Renal/GU negative Renal ROS  negative genitourinary   Musculoskeletal  (+) Arthritis , Osteoarthritis,    Abdominal   Peds negative pediatric ROS (+)  Hematology negative hematology ROS (+)   Anesthesia Other Findings   Reproductive/Obstetrics negative OB ROS                             Anesthesia Physical Anesthesia Plan  ASA: 3  Anesthesia Plan: General   Post-op Pain Management: Regional block   Induction: Intravenous  PONV Risk Score and Plan: 3 and Ondansetron, Dexamethasone, Midazolam and Treatment may vary due to age or medical condition  Airway Management Planned: LMA  Additional Equipment:   Intra-op Plan:   Post-operative Plan: Extubation in OR  Informed Consent: I have reviewed the patients History and Physical, chart, labs and discussed the procedure including the risks, benefits and alternatives for the proposed anesthesia with the patient or authorized representative who has indicated his/her understanding and acceptance.     Dental advisory given  Plan Discussed with: CRNA  Anesthesia Plan Comments:         Anesthesia Quick Evaluation

## 2021-10-31 NOTE — Progress Notes (Signed)
Assisted Dr. Miller with right, ultrasound guided, pectoralis block. Side rails up, monitors on throughout procedure. See vital signs in flow sheet. Tolerated Procedure well. 

## 2021-10-31 NOTE — Anesthesia Procedure Notes (Signed)
Procedure Name: LMA Insertion Date/Time: 10/31/2021 11:32 AM Performed by: Verita Lamb, CRNA Pre-anesthesia Checklist: Patient identified, Emergency Drugs available, Suction available and Patient being monitored Patient Re-evaluated:Patient Re-evaluated prior to induction Oxygen Delivery Method: Circle system utilized Preoxygenation: Pre-oxygenation with 100% oxygen Induction Type: IV induction Ventilation: Mask ventilation without difficulty LMA: LMA inserted LMA Size: 4.0 Number of attempts: 1 Airway Equipment and Method: Bite block Placement Confirmation: positive ETCO2, CO2 detector and breath sounds checked- equal and bilateral Tube secured with: Tape Dental Injury: Teeth and Oropharynx as per pre-operative assessment

## 2021-10-31 NOTE — Discharge Instructions (Signed)
°  Post Anesthesia Home Care Instructions  Activity: Get plenty of rest for the remainder of the day. A responsible individual must stay with you for 24 hours following the procedure.  For the next 24 hours, DO NOT: -Drive a car -Paediatric nurse -Drink alcoholic beverages -Take any medication unless instructed by your physician -Make any legal decisions or sign important papers.  Meals: Start with liquid foods such as gelatin or soup. Progress to regular foods as tolerated. Avoid greasy, spicy, heavy foods. If nausea and/or vomiting occur, drink only clear liquids until the nausea and/or vomiting subsides. Call your physician if vomiting continues.  Special Instructions/Symptoms: Your throat may feel dry or sore from the anesthesia or the breathing tube placed in your throat during surgery. If this causes discomfort, gargle with warm salt water. The discomfort should disappear within 24 hours.  If you had a scopolamine patch placed behind your ear for the management of post- operative nausea and/or vomiting:  1. The medication in the patch is effective for 72 hours, after which it should be removed.  Wrap patch in a tissue and discard in the trash. Wash hands thoroughly with soap and water. 2. You may remove the patch earlier than 72 hours if you experience unpleasant side effects which may include dry mouth, dizziness or visual disturbances. 3. Avoid touching the patch. Wash your hands with soap and water after contact with the patch.  No tylenol or ibuprofen until after 3:30 today if needed.  Regional Anesthesia Blocks  1. Numbness or the inability to move the "blocked" extremity may last from 3-48 hours after placement. The length of time depends on the medication injected and your individual response to the medication. If the numbness is not going away after 48 hours, call your surgeon.  2. The extremity that is blocked will need to be protected until the numbness is gone and the   Strength has returned. Because you cannot feel it, you will need to take extra care to avoid injury. Because it may be weak, you may have difficulty moving it or using it. You may not know what position it is in without looking at it while the block is in effect.  3. For blocks in the legs and feet, returning to weight bearing and walking needs to be done carefully. You will need to wait until the numbness is entirely gone and the strength has returned. You should be able to move your leg and foot normally before you try and bear weight or walk. You will need someone to be with you when you first try to ensure you do not fall and possibly risk injury.  4. Bruising and tenderness at the needle site are common side effects and will resolve in a few days.  5. Persistent numbness or new problems with movement should be communicated to the surgeon or the [place] [phone] [place] [phone]).

## 2021-10-31 NOTE — Anesthesia Postprocedure Evaluation (Signed)
Anesthesia Post Note  Patient: Maria Love  Procedure(s) Performed: RIGHT BREAST LUMPECTOMY WITH RADIOACTIVE SEED AND SENTINEL LYMPH NODE BIOPSY (Right: Breast)     Patient location during evaluation: PACU Anesthesia Type: General Level of consciousness: awake and alert Pain management: pain level controlled Vital Signs Assessment: post-procedure vital signs reviewed and stable Respiratory status: spontaneous breathing and respiratory function stable Cardiovascular status: stable Postop Assessment: no apparent nausea or vomiting Anesthetic complications: no   No notable events documented.  Last Vitals:  Vitals:   10/31/21 1330 10/31/21 1345  BP: (!) 144/64 135/74  Pulse: (!) 55 (!) 55  Resp: 10 11  Temp:    SpO2: 97% 98%    Last Pain:  Vitals:   10/31/21 1330  TempSrc:   PainSc: 4                  Ahja Martello DANIEL

## 2021-11-01 ENCOUNTER — Encounter (HOSPITAL_BASED_OUTPATIENT_CLINIC_OR_DEPARTMENT_OTHER): Payer: Self-pay | Admitting: General Surgery

## 2021-11-01 NOTE — Addendum Note (Signed)
Addendum  created 11/01/21 0932 by Eleny Cortez, Ernesta Amble, CRNA   Charge Capture section accepted

## 2021-11-06 ENCOUNTER — Encounter: Payer: Self-pay | Admitting: *Deleted

## 2021-11-06 ENCOUNTER — Telehealth: Payer: Self-pay | Admitting: *Deleted

## 2021-11-06 ENCOUNTER — Ambulatory Visit: Payer: Medicare Other

## 2021-11-06 LAB — SURGICAL PATHOLOGY

## 2021-11-06 NOTE — Telephone Encounter (Signed)
Ordered oncotype per Dr. Feng. Faxed requisition to pathology and exact sciences. °

## 2021-11-14 ENCOUNTER — Telehealth: Payer: Self-pay | Admitting: *Deleted

## 2021-11-14 NOTE — Telephone Encounter (Signed)
Called pt and left vm informing will schedule with Dr. Burr Medico and Dr. Lisbeth Renshaw after we receive oncotype results which is expected to be out on 3/6. Provided contact information for further questions.

## 2021-11-21 ENCOUNTER — Telehealth: Payer: Self-pay

## 2021-11-21 ENCOUNTER — Other Ambulatory Visit: Payer: Self-pay

## 2021-11-21 ENCOUNTER — Ambulatory Visit: Payer: Medicare Other | Attending: General Surgery

## 2021-11-21 DIAGNOSIS — R293 Abnormal posture: Secondary | ICD-10-CM | POA: Insufficient documentation

## 2021-11-21 DIAGNOSIS — Z170 Estrogen receptor positive status [ER+]: Secondary | ICD-10-CM | POA: Diagnosis present

## 2021-11-21 DIAGNOSIS — C50411 Malignant neoplasm of upper-outer quadrant of right female breast: Secondary | ICD-10-CM | POA: Diagnosis not present

## 2021-11-21 NOTE — Therapy (Signed)
[place] [facility] ?[place], [place], [address] ?Phone: [phone]   Fax:  [phone] ? ?Physical Therapy Treatment ? ?Patient Details  ?Name: Maria Love ?MRN: [id number] ?Date of Birth: 1951-07-05 ?Referring Provider (PT): Dr. Marlou Starks ? ? ?Encounter Date: 11/21/2021 ? ? PT End of Session - 11/21/21 1445   ? ? Visit Number 2   ? Number of Visits 2   ? Date for PT Re-Evaluation 11/21/21   ? PT Start Time 3817   ? PT Stop Time 7116   ? PT Time Calculation (min) 42 min   ? Activity Tolerance Patient tolerated treatment well   ? Behavior During Therapy [facility] for tasks assessed/performed   ? ?  ?  ? ?  ? ? ?Past Medical History:  ?Diagnosis Date  ? Allergy 1973  ? Antihistamines  ? Arthritis 2003  ? Breast cancer ([place]) 09/17/2021  ? Hypertension   ? Skin cancer 2006  ? ? ?Past Surgical History:  ?Procedure Laterality Date  ? BREAST LUMPECTOMY WITH RADIOACTIVE SEED AND SENTINEL LYMPH NODE BIOPSY Right 10/31/2021  ? Procedure: RIGHT BREAST LUMPECTOMY WITH RADIOACTIVE SEED AND SENTINEL LYMPH NODE BIOPSY;  Surgeon: Jovita Kussmaul, MD;  Location: [facility];  Service: General;  Laterality: Right;  ? EYE SURGERY  2017  ? cataracts  ? FOOT SURGERY Left 2012  ? FRACTURE SURGERY  2014 and 2018  ? screw in left foot  ? ? ?There were no vitals filed for this visit. ? ? Subjective Assessment - 11/21/21 1402   ? ? Subjective Pt is here for post surgical assessment. She is s/p surgery on 10/31/2021 for Right breast lumpectomy with SLNB. She has been working on her exercises for about 2 weeks. going out to the side is the hardest. She has no real limitations at home. She has been back to water aerobics since last Thursday for 4 visits and that is going well.   ? Pertinent History The patient appears to have a small stage I cancer in the upper outer quadrant of the right breast..  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  She is s/p right breast lumpectomy with SLNB on 10/31/2021. 1 LN removed. She has a hx of right shoulder disclocation many years ago.   ? Patient Stated Goals Be reassessed after surgery   ? Currently in Pain? No/denies   ? Pain Score 0-No pain   ? ?  ?  ? ?  ? ? ? ? ? [facility] PT Assessment - 11/21/21 0001   ? ?  ? Assessment  ? Medical Diagnosis Right breast Cancer   ? Referring Provider (PT) Dr. Marlou Starks   ? Onset Date/Surgical Date 10/16/21   ? Hand Dominance Right   ?  ? Precautions  ? Precaution Comments lymphedema risk   ?  ? Prior Function  ? Level of Independence Independent   ?  ? Cognition  ? Overall Cognitive Status Within Functional Limits for tasks assessed   ?  ? Observation/Other Assessments  ? Observations Incisions well healed with glue still present, firm area of scar tissue at axillary incision, bruising medial to nipple resolving, mild lateral breast edema   ?  ? AROM  ? Right Shoulder Extension 65 Degrees   ? Right Shoulder Flexion 150 Degrees   ? Right Shoulder ABduction 174 Degrees   ? Right Shoulder External Rotation 110 Degrees   ? ?  ?  ? ?  ? ? ? ?  LYMPHEDEMA/ONCOLOGY QUESTIONNAIRE - 11/21/21 0001   ? ?  ? Type  ? Cancer Type Right breast CA   ?  ? Surgeries  ? Lumpectomy Date 10/31/21   ? Sentinel Lymph Node Biopsy Date 10/31/21   ? Number Lymph Nodes Removed 1   ?  ? Treatment  ? Active Chemotherapy Treatment No   ? Past Chemotherapy Treatment No   ? Active Radiation Treatment No   ? Past Radiation Treatment No   ? Current Hormone Treatment --   pending  ? Past Hormone Therapy No   ?  ? What other symptoms do you have  ? Are you Having Heaviness or Tightness --   occasional in right breast  ?  ? Right Upper Extremity Lymphedema  ? 15 cm Proximal to Olecranon Process 30.4 cm   ? 10 cm Proximal to Olecranon Process 30 cm   ? Olecranon Process 27.2 cm   ? 15 cm Proximal to Ulnar Styloid Process 25.6 cm   ? 10 cm Proximal to Ulnar Styloid Process 22.6 cm   ? Just Proximal to Ulnar  Styloid Process 16.9 cm   ? At [facility] of 2nd Digit 6 cm   ? ?  ?  ? ?  ? ? ? ? ? ? ? ? ? ? ? ? ? ? ? ? ? ? ? ? ? ? PT Education - 11/21/21 1435   ? ? Education Details Access Code: [email or web address]  URL: [email or web address]  Date: 11/21/2021  Prepared by: Cheral Almas    Exercises  Corner Pec Major Stretch - 1 x daily - 7 x weekly - 1 sets - 3 reps - 20 hold   ? ?  ?  ? ?  ? ? ? ? ? ? PT Long Term Goals - 11/21/21 1447   ? ?  ? PT LONG TERM GOAL #1  ? Title Pt will restore Right shoulder AROM to within 5-10 degrees of baseline for return to normal daily activities   ? Time 6   ? Period Weeks   ? Status Achieved   ? Target Date 11/21/21   ? ?  ?  ? ?  ? ? ? ? ? ? ? ? Plan - 11/21/21 1447   ? ? Clinical Impression Statement Pt was seen for post op follow up.  She has achieved ROM goals, and Right UE circumferential measures remain similar to baseline.  Her incisions are healing well with glue still present.  She was given some adhesive remover to  help her get it off.  She was instructed in scar massage to her incisions, and was shown standing corner chest stretch. She is set up for the March 6 ABC class. She has mild swelling of the right breast with some firm scar tissue present under the SLNB incision.  She was given a script for a compression bra in case swelling does not continue to improve. She has no further PT needs at this time but was advised to email or call with questions or concerns. She was also advised to continue post op exercises during radiation and to watch for swelling.   ? Personal Factors and Comorbidities Comorbidity 1;Comorbidity 2   ? Comorbidities Right breast Cancer with likely radiation, prior right shoulder dislocation years ago   ? Stability/Clinical Decision Making Stable/Uncomplicated   ? Clinical Decision Making Low   ? Rehab Potential Excellent   ? PT Frequency --   no further visits scheduled  ? PT Treatment/Interventions  ADLs/Self Care Home Management;Therapeutic  exercise;Patient/family education;Scar mobilization   ? PT Next Visit Plan Pt has no further PT  needs at this time.Advised to call or email with questions or concerns   ? PT Home Exercise Plan 4 post op exercises, pec wall stretch,   ? Consulted and Agree with Plan of Care Patient   ? ?  ?  ? ?  ? ? ?Patient will benefit from skilled therapeutic intervention in order to improve the following deficits and impairments:  Postural dysfunction, Decreased knowledge of precautions, Decreased scar mobility, Increased edema ? ?Visit Diagnosis: ?Malignant neoplasm of upper-outer quadrant of right breast in female, estrogen receptor positive ([place]) ? ?Abnormal posture ? ? ? ? ?Problem List ?Patient Active Problem List  ? Diagnosis Date Noted  ? Malignant neoplasm of upper-outer quadrant of right breast in female, estrogen receptor positive ([place]) 09/30/2021  ? Benign hypertension 07/28/2017  ? Cellulitis of face 07/28/2017  ? Contact dermatitis 07/28/2017  ? Foot pain 07/28/2017  ? Seasonal allergies 07/28/2017  ? Hx of migraines 07/28/2017  ? ? ?Claris Pong, PT ?11/21/2021, 2:57 PM ? ? ?[facility] ?[place], [place], [address] ?Phone: [phone]   Fax:  [phone] ? ?Name: Maria Love ?MRN: [id number] ?Date of Birth: [birth year] ? ? ? ?

## 2021-11-21 NOTE — Telephone Encounter (Signed)
Maria Love w/Oncotype DX called stating she has made numerous attempts to send a prior authorization for test Dr. Burr Medico ordered.  Returned Maria Love's call [phone] option#2) and spoke with Maria Love.  Informed Maria Love that Dr. Ernestina Penna office has not received the fax that [facility] referenced in her voicemail.  Maria Love and I confirmed the fax number and Maria Love stated she would resend fax for PA (this was around 11am EST).  After never receiving the fax, this RN return call to Oncotype DX (got Maria Love again) stating the PA was not received via fax again today.  Maria Love stated she would sent the document to this RN's email address because she had made 4 to 5 attempt to fax it again today but each time she got an error message.  As of 4:25pm today, still waiting for e-mail to arrive.  Will follow-up tomorrow is email has not arrived in the morning. ?

## 2021-11-21 NOTE — Patient Instructions (Addendum)
? ? ?   [facility] ? [address], [address] ? [place] [address] ? [phone] ? ?After Breast Cancer Class; Monday March 6, 11-12 ?It is recommended you attend the ABC class to be educated on lymphedema risk reduction. This class is free of charge and lasts for 1 hour. It is a 1-time class. You will need to download the Webex app either on your phone or computer. We will send you a link the night before or the morning of the class. You should be able to click on that link to join the class. This is not a confidential class. You don't have to turn your camera on, but other participants may be able to see your email address. ? ?Scar massage ?You can begin gentle scar massage to you incision sites. Gently place one hand on the incision and move the skin (without sliding on the skin) in various directions. Do this for a few minutes and then you can gently massage either coconut oil or vitamin E cream into the scars. ? ?Compression garment ?You should continue wearing your compression bra until you feel like you no longer have swelling. ? ?Home exercise Program ?Continue doing the exercises you were given until you feel like you can do them without feeling any tightness at the end.  ? ?Walking Program ?Studies show that 30 minutes of walking per day (fast enough to elevate your heart rate) can significantly reduce the risk of a cancer recurrence. If you can't walk due to other medical reasons, we encourage you to find another activity you could do (like a stationary bike or water exercise). ? ?Posture ?After breast cancer surgery, people frequently sit with rounded shoulders posture because it puts their incisions on slack and feels better. If you sit like this and scar tissue forms in that position, you can become very tight and have pain sitting or standing with good posture. Try to be aware of your posture and sit and stand up tall to heal properly. ? ?Follow up PT: ?It is recommended you  return every 3 months for the first 3 years following surgery to be assessed on the SOZO machine for an L-Dex score. This helps prevent clinically significant lymphedema in 95% of patients. These follow up screens are 10 minute appointments that you are not billed for. ? ?

## 2021-11-28 ENCOUNTER — Encounter: Payer: Self-pay | Admitting: *Deleted

## 2021-11-28 ENCOUNTER — Telehealth: Payer: Self-pay | Admitting: *Deleted

## 2021-11-28 NOTE — Telephone Encounter (Signed)
Called [facility] for update on oncotype score. Notified that PA needed to be initiated. Informed PA has been submitted. ?Provided number for [employer] prior auth. ?Clinical information was requested. Msg sent to Dr. Burr Medico nurse to fax records to [phone] ? ?Called pt with update on insurance requiring further clinical information ? ?

## 2021-11-29 NOTE — Progress Notes (Signed)
Faxed Surgical Pathology report and last office visit note per request to [phone]. No further concerns at this time.   ?

## 2021-12-04 ENCOUNTER — Telehealth: Payer: Self-pay | Admitting: *Deleted

## 2021-12-04 ENCOUNTER — Encounter: Payer: Self-pay | Admitting: *Deleted

## 2021-12-04 NOTE — Telephone Encounter (Signed)
Called [facility] for an update on prior authorization for oncotype testing.  ?Informed that authorization is still pending from the insurance company. ?Called pt, provided update on status.  ?

## 2021-12-05 ENCOUNTER — Telehealth: Payer: Self-pay | Admitting: *Deleted

## 2021-12-05 ENCOUNTER — Encounter: Payer: Self-pay | Admitting: *Deleted

## 2021-12-05 ENCOUNTER — Telehealth: Payer: Self-pay | Admitting: Radiation Oncology

## 2021-12-05 ENCOUNTER — Encounter (HOSPITAL_COMMUNITY): Payer: Self-pay

## 2021-12-05 NOTE — Telephone Encounter (Signed)
Called patient to schedule a follow up appointment w. Alison. No answer, LVM for a return call. 

## 2021-12-05 NOTE — Telephone Encounter (Signed)
Received oncotype score of 14. Physician team notified. Called pt, no answer. Msg sent with resutls of 14 and chemo not recommended. Informed next step will be xrt with Dr. Lisbeth Renshaw. ?Referral placed.  ?

## 2021-12-09 ENCOUNTER — Telehealth: Payer: Self-pay

## 2021-12-09 ENCOUNTER — Encounter (HOSPITAL_COMMUNITY): Payer: Self-pay

## 2021-12-09 NOTE — Telephone Encounter (Signed)
Left voicemail reminder of patient's 8:00am-12/10/21 in-person appointment w/ Shona Simpson PA-C. I advised patient to arrive 6mn early for check-in. I left my extension [phone] case patient needs anything. ?

## 2021-12-09 NOTE — Progress Notes (Signed)
?Radiation Oncology         [phone] ?________________________________ ? ?Name: Maria Love        MRN: [id number]  ?Date of Service: 12/10/2021 DOB: Dec 12, 1950 ? ?EQ:[id number], Joelene Millin, MD  Truitt Merle, MD    ? ?REFERRING PHYSICIAN: Truitt Merle, MD ? ? ?DIAGNOSIS: The encounter diagnosis was Malignant neoplasm of upper-outer quadrant of right breast in female, estrogen receptor positive ([place]). ? ? ?HISTORY OF PRESENT ILLNESS: Maria Love is a 71 y.o. female with a diagnosis of right breast cancer.  The patient has screening mammogram in October 2022 which showed a possible mass in the right breast there were some calcifications that were favored to be stable but diagnostic imaging on 09/05/2021 showed a persistent change in the upper outer right breast and more diffuse calcifications.  By ultrasound at 11:00 there was a small hypoechoic mass measuring 6 mm with ill-defined margins.  It was consistent with the mammographic finding in her axilla of the right side was negative for adenopathy.  There were scattered benign-appearing bilateral calcifications as well.  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  Of note she also is followed by Dr. Roxan Hockey for a right lower lobe nodule. ? ? ?Since her last visit, she did have an MRI of the breasts on 10/13/2021 that showed a 5 mm enhancing focus in the upper inner quadrant of the left breast, her known 2.4 cm enhancing mass in the right breast was also seen and no evidence of any other findings or lymphadenopathy was noted.  Her left biopsy on 10/16/2021 showed usual ductal and columnar cell hyperplasia with fibrocystic change no malignancy was noted.  She then underwent right lumpectomy with sentinel lymph node biopsy on 10/31/2021 which revealed a grade 2 invasive lobular carcinoma measuring 2.1 cm with focal lobular carcinoma in situ.   Her margins were negative and a single sentinel lymph node was negative for malignancy.  Her Oncotype score was 14 and no systemic chemotherapy is recommended.  She is seen to discuss adjuvant radiotherapy for her right breast. ? ? ?PREVIOUS RADIATION THERAPY: No ? ? ?PAST MEDICAL HISTORY:  ?Past Medical History:  ?Diagnosis Date  ? Allergy 1973  ? Antihistamines  ? Arthritis 2003  ? Breast cancer ([place]) 09/17/2021  ? Hypertension   ? Skin cancer 2006  ?   ? ? ?PAST SURGICAL HISTORY: ?Past Surgical History:  ?Procedure Laterality Date  ? BREAST LUMPECTOMY WITH RADIOACTIVE SEED AND SENTINEL LYMPH NODE BIOPSY Right 10/31/2021  ? Procedure: RIGHT BREAST LUMPECTOMY WITH RADIOACTIVE SEED AND SENTINEL LYMPH NODE BIOPSY;  Surgeon: Jovita Kussmaul, MD;  Location: [facility];  Service: General;  Laterality: Right;  ? EYE SURGERY  2017  ? cataracts  ? FOOT SURGERY Left 2012  ? FRACTURE SURGERY  2014 and 2018  ? screw in left foot  ? ? ? ?FAMILY HISTORY:  ?Family History  ?Problem Relation Age of Onset  ? Arthritis Mother   ? Hypertension Mother   ? Hyperlipidemia Mother   ? Mitral valve prolapse Mother   ? Cancer Father 26  ?     pancreatic cancer  ? Pancreatic cancer Father   ? Arthritis Sister   ? Hyperlipidemia Sister   ? Hypertension Sister   ? Hyperlipidemia Sister   ? Cancer Paternal Uncle   ?     colon  cancer  ? Arthritis Maternal Grandmother   ? Hypertension Maternal Grandmother   ? Stroke Maternal Grandmother   ? Diabetes Maternal Grandfather   ? Arthritis Paternal Grandmother   ? Macular degeneration Paternal Grandmother   ? Heart disease Paternal Grandmother   ? Thyroid cancer Son   ? Breast cancer Neg Hx   ? ? ? ?SOCIAL HISTORY:  reports that she quit smoking about 48 years ago. Her smoking use included cigarettes. She has a 0.13 pack-year smoking history. She has never used smokeless tobacco. She reports that she does not drink alcohol and does not use drugs.  The patient is single and lives in  [place]. She enjoys water based exercise.  ? ?ALLERGIES: Antihistamines, chlorpheniramine-type ? ? ?MEDICATIONS:  ?Current Outpatient Medications  ?Medication Sig Dispense Refill  ? aspirin EC 81 MG tablet Take 1 tablet (81 mg total) by mouth daily. Swallow whole. 90 tablet 3  ? atorvastatin (LIPITOR) 40 MG tablet TAKE 1 TABLET BY MOUTH DAILY 90 tablet 1  ? Coenzyme Q10 (COQ10) 100 MG CAPS Take 1 capsule by mouth daily.    ? HYDROcodone-acetaminophen (NORCO/VICODIN) 5-325 MG tablet Take 1 tablet by mouth every 6 (six) hours as needed for moderate pain or severe pain. 10 tablet 0  ? metoprolol succinate (TOPROL-XL) 25 MG 24 hr tablet Take 1 tablet (25 mg total) by mouth daily. 90 tablet 1  ? NON FORMULARY Take 1 capsule by mouth daily. Vitamin K2 D3    ? traMADol (ULTRAM) 50 MG tablet Take 1 tablet (50 mg total) by mouth every 6 (six) hours as needed. 20 tablet 0  ? ?No current facility-administered medications for this visit.  ? ? ? ?REVIEW OF SYSTEMS: On review of systems, the patient reports that she is doing well overall. She is back to her water exercise. She reports baseline range of motion and some soreness along her axilla and posterior shoulder region but denies any concerns with her incision site. No other complaints are verbalized, but she is interested in talking with Dr. Burr Medico about transitioning her care to [place] later in the year as she plans to move to the [place] area in [place].  ? ?  ? ?PHYSICAL EXAM:  ?Wt Readings from Last 3 Encounters:  ?10/31/21 167 lb 15.9 oz (76.2 kg)  ?10/10/21 168 lb 12.8 oz (76.6 kg)  ?10/10/21 167 lb 12.8 oz (76.1 kg)  ? ?Temp Readings from Last 3 Encounters:  ?10/31/21 98.8 ?F (37.1 ?C)  ?10/10/21 (!) 97.5 ?F (36.4 ?C)  ?10/10/21 97.8 ?F (36.6 ?C) (Oral)  ? ?BP Readings from Last 3 Encounters:  ?10/31/21 (!) 160/73  ?10/10/21 (!) 145/66  ?10/10/21 (!) 144/73  ? ?Pulse Readings from Last 3 Encounters:  ?10/31/21 (!) 56  ?10/10/21 73  ?10/10/21 70  ? ? ?In  general this is a well appearing Caucasian female in no acute distress. She's alert and oriented x4 and appropriate throughout the examination. Cardiopulmonary assessment is negative for acute distress and she exhibits normal effort. Her right breast and axillary incisions are both intact without erythema separation or drainage. ? ? ? ?ECOG = 0 ? ?0 - Asymptomatic (Fully active, able to carry on all predisease activities without restriction) ? ?1 - Symptomatic but completely ambulatory (Restricted in physically strenuous activity but ambulatory and able to carry out work of a light or sedentary nature. For example, light housework, office work) ? ?2 - Symptomatic, <50% in bed during the day (Ambulatory and capable of all self care  but unable to carry out any work activities. Up and about more than 50% of waking hours) ? ?3 - Symptomatic, >50% in bed, but not bedbound (Capable of only limited self-care, confined to bed or chair 50% or more of waking hours) ? ?4 - Bedbound (Completely disabled. Cannot carry on any self-care. Totally confined to bed or chair) ? ?5 - Death ? ? Oken MM, Creech RH, Tormey DC, et al. [id number]). "Toxicity and response criteria of the [facility]". [place] Oncol. 5 (6): 649-55 ? ? ? ?LABORATORY DATA:  ?Lab Results  ?Component Value Date  ? WBC 9.1 10/10/2021  ? HGB 15.3 (H) 10/10/2021  ? HCT 45.9 10/10/2021  ? MCV 90.4 10/10/2021  ? PLT 253 10/10/2021  ? ?Lab Results  ?Component Value Date  ? NA 142 10/10/2021  ? K 4.5 10/10/2021  ? CL 107 10/10/2021  ? CO2 30 10/10/2021  ? ?Lab Results  ?Component Value Date  ? ALT 46 (H) 10/10/2021  ? AST 28 10/10/2021  ? ALKPHOS 142 (H) 10/10/2021  ? BILITOT 0.5 10/10/2021  ? ?  ? ?RADIOGRAPHY: No results found. ?   ? ?IMPRESSION/PLAN: ?1. 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 Dr. Lisbeth Renshaw has reviewed her case, and today I discussed her final pathology findings and reviewed the  nature of early stage right breast disease.  She has done well since surgery and does not need chemotherapy.  We reviewed the rationale for external radiotherapy to the breast  to reduce risks of local recurrence f

## 2021-12-10 ENCOUNTER — Ambulatory Visit
Admission: RE | Admit: 2021-12-10 | Discharge: 2021-12-10 | Disposition: A | Discharge status: Home / Self Care | Payer: Medicare Other | Source: Ambulatory Visit | Attending: Radiation Oncology | Admitting: Radiation Oncology

## 2021-12-10 ENCOUNTER — Encounter: Payer: Self-pay | Admitting: Radiation Oncology

## 2021-12-10 ENCOUNTER — Other Ambulatory Visit: Payer: Self-pay

## 2021-12-10 VITALS — BP 144/74 | HR 84 | Temp 97.2°F | Resp 18 | Ht 66.0 in | Wt 167.1 lb

## 2021-12-10 DIAGNOSIS — Z170 Estrogen receptor positive status [ER+]: Secondary | ICD-10-CM | POA: Diagnosis not present

## 2021-12-10 DIAGNOSIS — R918 Other nonspecific abnormal finding of lung field: Secondary | ICD-10-CM | POA: Insufficient documentation

## 2021-12-10 DIAGNOSIS — Z7982 Long term (current) use of aspirin: Secondary | ICD-10-CM | POA: Diagnosis not present

## 2021-12-10 DIAGNOSIS — C50411 Malignant neoplasm of upper-outer quadrant of right female breast: Secondary | ICD-10-CM

## 2021-12-10 DIAGNOSIS — Z800 Family history of malignant neoplasm of digestive organs: Secondary | ICD-10-CM | POA: Diagnosis not present

## 2021-12-10 DIAGNOSIS — I10 Essential (primary) hypertension: Secondary | ICD-10-CM | POA: Diagnosis not present

## 2021-12-10 DIAGNOSIS — M129 Arthropathy, unspecified: Secondary | ICD-10-CM | POA: Diagnosis not present

## 2021-12-10 DIAGNOSIS — Z79899 Other long term (current) drug therapy: Secondary | ICD-10-CM | POA: Insufficient documentation

## 2021-12-10 NOTE — Progress Notes (Signed)
Patient here for a "FUN" appointment w/ Shona Simpson PA-C. I verified patient identity and began nursing interview. Patient reports mild RT breast and posterior axilla tenderness 1/10. No other issues reported to me at this time. ? ?Meaningful use complete. ?Postmenopausal-No chances of pregnancy. ? ?BP (!) 144/74 (BP Location: Left Arm, Patient Position: Sitting)   Pulse 84   Temp (!) 97.2 ?F (36.2 ?C) (Temporal)   Resp 18   Ht '5\' 6"'$  (1.676 m)   Wt 167 lb 2 oz (75.8 kg)   LMP  (LMP Unknown)   SpO2 98%   BMI 26.97 kg/m?  ? ?

## 2021-12-17 ENCOUNTER — Encounter: Payer: Self-pay | Admitting: *Deleted

## 2021-12-19 ENCOUNTER — Other Ambulatory Visit: Payer: Self-pay

## 2021-12-19 ENCOUNTER — Ambulatory Visit
Admission: RE | Admit: 2021-12-19 | Discharge: 2021-12-19 | Disposition: A | Discharge status: Home / Self Care | Payer: Medicare Other | Source: Ambulatory Visit | Attending: Radiation Oncology | Admitting: Radiation Oncology

## 2021-12-19 DIAGNOSIS — Z170 Estrogen receptor positive status [ER+]: Secondary | ICD-10-CM | POA: Insufficient documentation

## 2021-12-19 DIAGNOSIS — C50411 Malignant neoplasm of upper-outer quadrant of right female breast: Secondary | ICD-10-CM | POA: Diagnosis not present

## 2021-12-23 ENCOUNTER — Encounter: Payer: Self-pay | Admitting: *Deleted

## 2021-12-24 ENCOUNTER — Telehealth: Payer: Self-pay | Admitting: Hematology

## 2021-12-24 DIAGNOSIS — C50411 Malignant neoplasm of upper-outer quadrant of right female breast: Secondary | ICD-10-CM | POA: Insufficient documentation

## 2021-12-24 DIAGNOSIS — Z170 Estrogen receptor positive status [ER+]: Secondary | ICD-10-CM | POA: Diagnosis present

## 2021-12-24 NOTE — Telephone Encounter (Signed)
.  Called pt per 4/4 inbasket , Patient was unavailable, a message with appt time and date was left with number on file.   ?

## 2021-12-26 ENCOUNTER — Ambulatory Visit
Admission: RE | Admit: 2021-12-26 | Discharge: 2021-12-26 | Disposition: A | Discharge status: Home / Self Care | Payer: Medicare Other | Source: Ambulatory Visit | Attending: Radiation Oncology | Admitting: Radiation Oncology

## 2021-12-26 ENCOUNTER — Other Ambulatory Visit: Payer: Self-pay

## 2021-12-26 DIAGNOSIS — C50411 Malignant neoplasm of upper-outer quadrant of right female breast: Secondary | ICD-10-CM | POA: Diagnosis not present

## 2021-12-27 ENCOUNTER — Ambulatory Visit
Admission: RE | Admit: 2021-12-27 | Discharge: 2021-12-27 | Disposition: A | Discharge status: Home / Self Care | Payer: Medicare Other | Source: Ambulatory Visit | Attending: Radiation Oncology | Admitting: Radiation Oncology

## 2021-12-27 DIAGNOSIS — C50411 Malignant neoplasm of upper-outer quadrant of right female breast: Secondary | ICD-10-CM | POA: Diagnosis not present

## 2021-12-27 DIAGNOSIS — Z170 Estrogen receptor positive status [ER+]: Secondary | ICD-10-CM

## 2021-12-27 MED ORDER — RADIAPLEXRX EX GEL: Freq: Once | CUTANEOUS | Status: AC

## 2021-12-27 NOTE — Progress Notes (Signed)
Pt here for patient teaching.  Pt given Radiation and You booklet, skin care instructions, and Radiaplex.  Patient was advised to obtain over the counter aluminum free deodorant.  Reviewed areas of pertinence such as fatigue, hair loss, skin changes, breast tenderness, and breast swelling. Pt able to give teach back of to pat skin and use unscented/gentle soap,apply Radiaplex bid, avoid applying anything to skin within 4 hours of treatment, avoid wearing an under wire bra, and to use an electric razor if they must shave. Pt verbalizes understanding of information given and will contact nursing with any questions or concerns.   ? ?Gloriajean Dell. Leonie Green, BSN  ?

## 2021-12-30 ENCOUNTER — Other Ambulatory Visit: Payer: Self-pay

## 2021-12-30 ENCOUNTER — Ambulatory Visit
Admission: RE | Admit: 2021-12-30 | Discharge: 2021-12-30 | Disposition: A | Discharge status: Home / Self Care | Payer: Medicare Other | Source: Ambulatory Visit | Attending: Radiation Oncology | Admitting: Radiation Oncology

## 2021-12-30 DIAGNOSIS — C50411 Malignant neoplasm of upper-outer quadrant of right female breast: Secondary | ICD-10-CM | POA: Diagnosis not present

## 2021-12-31 ENCOUNTER — Ambulatory Visit
Admission: RE | Admit: 2021-12-31 | Discharge: 2021-12-31 | Disposition: A | Discharge status: Home / Self Care | Payer: Medicare Other | Source: Ambulatory Visit | Attending: Radiation Oncology | Admitting: Radiation Oncology

## 2021-12-31 DIAGNOSIS — C50411 Malignant neoplasm of upper-outer quadrant of right female breast: Secondary | ICD-10-CM | POA: Diagnosis not present

## 2022-01-01 ENCOUNTER — Other Ambulatory Visit: Payer: Self-pay

## 2022-01-01 ENCOUNTER — Ambulatory Visit
Admission: RE | Admit: 2022-01-01 | Discharge: 2022-01-01 | Disposition: A | Discharge status: Home / Self Care | Payer: Medicare Other | Source: Ambulatory Visit | Attending: Radiation Oncology | Admitting: Radiation Oncology

## 2022-01-01 DIAGNOSIS — C50411 Malignant neoplasm of upper-outer quadrant of right female breast: Secondary | ICD-10-CM | POA: Diagnosis not present

## 2022-01-02 ENCOUNTER — Ambulatory Visit
Admission: RE | Admit: 2022-01-02 | Discharge: 2022-01-02 | Disposition: A | Discharge status: Home / Self Care | Payer: Medicare Other | Source: Ambulatory Visit | Attending: Radiation Oncology | Admitting: Radiation Oncology

## 2022-01-02 DIAGNOSIS — C50411 Malignant neoplasm of upper-outer quadrant of right female breast: Secondary | ICD-10-CM | POA: Diagnosis not present

## 2022-01-03 ENCOUNTER — Other Ambulatory Visit: Payer: Self-pay

## 2022-01-03 ENCOUNTER — Ambulatory Visit
Admission: RE | Admit: 2022-01-03 | Discharge: 2022-01-03 | Disposition: A | Discharge status: Home / Self Care | Payer: Medicare Other | Source: Ambulatory Visit | Attending: Radiation Oncology | Admitting: Radiation Oncology

## 2022-01-03 DIAGNOSIS — C50411 Malignant neoplasm of upper-outer quadrant of right female breast: Secondary | ICD-10-CM | POA: Diagnosis not present

## 2022-01-06 ENCOUNTER — Other Ambulatory Visit: Payer: Self-pay

## 2022-01-06 ENCOUNTER — Ambulatory Visit
Admission: RE | Admit: 2022-01-06 | Discharge: 2022-01-06 | Disposition: A | Discharge status: Home / Self Care | Payer: Medicare Other | Source: Ambulatory Visit | Attending: Radiation Oncology | Admitting: Radiation Oncology

## 2022-01-06 DIAGNOSIS — C50411 Malignant neoplasm of upper-outer quadrant of right female breast: Secondary | ICD-10-CM | POA: Diagnosis not present

## 2022-01-07 ENCOUNTER — Other Ambulatory Visit: Payer: Self-pay

## 2022-01-07 ENCOUNTER — Ambulatory Visit
Admission: RE | Admit: 2022-01-07 | Discharge: 2022-01-07 | Disposition: A | Discharge status: Home / Self Care | Payer: Medicare Other | Source: Ambulatory Visit | Attending: Radiation Oncology | Admitting: Radiation Oncology

## 2022-01-07 DIAGNOSIS — C50411 Malignant neoplasm of upper-outer quadrant of right female breast: Secondary | ICD-10-CM | POA: Diagnosis not present

## 2022-01-07 LAB — RAD ONC ARIA SESSION SUMMARY
Course Elapsed Days: 12
Plan Fractions Treated to Date: 9
Plan Prescribed Dose Per Fraction: 2.66 Gy
Plan Total Fractions Prescribed: 16
Plan Total Prescribed Dose: 42.56 Gy
Reference Point Dosage Given to Date: 23.94 Gy
Reference Point Session Dosage Given: 2.66 Gy
Session Number: 9

## 2022-01-08 ENCOUNTER — Other Ambulatory Visit: Payer: Self-pay

## 2022-01-08 ENCOUNTER — Ambulatory Visit
Admission: RE | Admit: 2022-01-08 | Discharge: 2022-01-08 | Disposition: A | Discharge status: Home / Self Care | Payer: Medicare Other | Source: Ambulatory Visit | Attending: Radiation Oncology | Admitting: Radiation Oncology

## 2022-01-08 ENCOUNTER — Encounter: Payer: Self-pay | Admitting: Radiation Oncology

## 2022-01-08 DIAGNOSIS — C50411 Malignant neoplasm of upper-outer quadrant of right female breast: Secondary | ICD-10-CM | POA: Diagnosis not present

## 2022-01-08 LAB — RAD ONC ARIA SESSION SUMMARY
Course Elapsed Days: 13
Plan Fractions Treated to Date: 10
Plan Prescribed Dose Per Fraction: 2.66 Gy
Plan Total Fractions Prescribed: 16
Plan Total Prescribed Dose: 42.56 Gy
Reference Point Dosage Given to Date: 26.6 Gy
Reference Point Session Dosage Given: 2.66 Gy
Session Number: 10

## 2022-01-09 ENCOUNTER — Other Ambulatory Visit: Payer: Self-pay

## 2022-01-09 ENCOUNTER — Ambulatory Visit
Admission: RE | Admit: 2022-01-09 | Discharge: 2022-01-09 | Disposition: A | Discharge status: Home / Self Care | Payer: Medicare Other | Source: Ambulatory Visit | Attending: Radiation Oncology | Admitting: Radiation Oncology

## 2022-01-09 DIAGNOSIS — C50411 Malignant neoplasm of upper-outer quadrant of right female breast: Secondary | ICD-10-CM | POA: Diagnosis not present

## 2022-01-09 LAB — RAD ONC ARIA SESSION SUMMARY
Course Elapsed Days: 14
Plan Fractions Treated to Date: 11
Plan Prescribed Dose Per Fraction: 2.66 Gy
Plan Total Fractions Prescribed: 16
Plan Total Prescribed Dose: 42.56 Gy
Reference Point Dosage Given to Date: 29.26 Gy
Reference Point Session Dosage Given: 2.66 Gy
Session Number: 11

## 2022-01-09 NOTE — Progress Notes (Signed)
? ?Primary Physician/Referring:  Willey Blade, MD ? ?Patient ID: Maria Love, female    DOB: [birth year], 71 y.o.   MRN: [id number] ? ?Chief Complaint  ?Patient presents with  ?? Coronary Artery Disease  ?  7 month  ? ?HPI:   ? ?Maria Love  is a 71 y.o.  Caucasian female who is fairly active, walks for at least 4 miles a day, does deep water aerobics equaling to 5 miles a day for 3 times a week, also does stationary bicycle for 30 minutes daily with no symptoms of chest pain or dyspnea, originally referred to our office for evaluation of abnormal EKG.  History significant for hypertension and hyperlipidemia. ? ?She subsequently underwent coronary calcium score, which is in the 90th percentile as well as echocardiogram revealing normal LVEF.  She subsequently underwent stress test which was overall low risk. ? ?Patient presents for 33-monthfollow-up.  Last office visit added aspirin 81 mg daily given elevated coronary calcium score and recommended ARB therapy, however patient was resistant to this.  Blood pressure is now well controlled at today's office visit.  Patient is asymptomatic from a cardiovascular standpoint and feeling well overall.  Although since last office visit patient was diagnosed with breast cancer and underwent right breast lumpectomy in February 2023.  Patient has recovered well and is presently undergoing radiation therapy. ? ?[place] ? ?Patient reports history of cough with ACE inhibitor's. ? ?Past Medical History:  ?Diagnosis Date  ?? Allergy 1973  ? Antihistamines  ?? Arthritis 2003  ?? Breast cancer ([place] 09/17/2021  ?? Hypertension   ?? Skin cancer 2006  ? ?Past Surgical History:  ?Procedure Laterality Date  ?? BREAST LUMPECTOMY WITH RADIOACTIVE SEED AND SENTINEL LYMPH NODE BIOPSY Right 10/31/2021  ? Procedure: RIGHT BREAST LUMPECTOMY WITH RADIOACTIVE SEED AND SENTINEL LYMPH NODE BIOPSY;   Surgeon: TJovita Kussmaul MD;  Location[place] [facility]  Service: General;  Laterality: Right;  ?? EYE SURGERY  2017  ? cataracts  ?? FOOT SURGERY Left 2012  ?? FRACTURE SURGERY  2014 and 2018  ? screw in left foot  ? ?Family History  ?Problem Relation Age of Onset  ?? Arthritis Mother   ?? Hypertension Mother   ?? Hyperlipidemia Mother   ?? Mitral valve prolapse Mother   ?? Cancer Father 773 ?     pancreatic cancer  ?? Pancreatic cancer Father   ?? Arthritis Sister   ?? Hyperlipidemia Sister   ?? Hypertension Sister   ?? Hyperlipidemia Sister   ?? Cancer Paternal Uncle   ?     colon cancer  ?? Arthritis Maternal Grandmother   ?? Hypertension Maternal Grandmother   ?? Stroke Maternal Grandmother   ?? Diabetes Maternal Grandfather   ?? Arthritis Paternal Grandmother   ?? Macular degeneration Paternal Grandmother   ?? Heart disease Paternal Grandmother   ?? Thyroid cancer Son   ?? Breast cancer Neg Hx   ?  ?Social History  ? ?Tobacco Use  ?? Smoking status: Former  ?  Packs/day: 0.25  ?  Years: 0.50  ?  Pack years: 0.13  ?  Types: Cigarettes  ?  Quit date: 01/31/1973  ?  Years since quitting: 48.9  ?? Smokeless tobacco: Never  ?? Tobacco comments:  ?  Mostly passive,  smoked some with college friends  ?Substance Use Topics  ?? Alcohol use: No  ? ?  Marital Status: Single  ?ROS  ?Review of Systems  ?Constitutional: Negative for malaise/fatigue and weight gain.  ?Cardiovascular:  Positive for dyspnea on exertion (stable). Negative for chest pain, claudication, leg swelling, near-syncope, orthopnea, palpitations, paroxysmal nocturnal dyspnea and syncope.  ?Respiratory:  Negative for shortness of breath.   ?Gastrointestinal:  Negative for melena.  ?Objective  ?Blood pressure 135/69, pulse 73, temperature 98 ?F (36.7 ?C), temperature source Temporal, resp. rate 17, height _0  (1.676 m), weight 171 lb 3.2 oz (77.7 kg), SpO2 99 %. Body mass index is 27.63 kg/m?.  ? ?  01/10/2022  ? 12:55 PM 01/10/2022  ? 12:47 PM  12/10/2021  ?  8:00 AM  ?Vitals with BMI  ?Height  _1  _2   ?Weight  171 lbs 3 oz 167 lbs 2 oz  ?BMI  27.65 26.99  ?Systolic 161 096 045  ?Diastolic 69 75 74  ?Pulse 73 73 84  ?  ?Physical Exam ?Vitals reviewed.  ?Constitutional:   ?   General: She is not in acute distress. ?Neck:  ?   Vascular: No JVD.  ?Cardiovascular:  ?   Rate and Rhythm: Normal rate and regular rhythm.  ?   Pulses: Normal pulses and intact distal pulses.  ?   Heart sounds: Normal heart sounds, S1 normal and S2 normal. No murmur heard. ?  No gallop.  ?Pulmonary:  ?   Effort: Pulmonary effort is normal. No respiratory distress.  ?   Breath sounds: Normal breath sounds. No wheezing, rhonchi or rales.  ?Chest:  ?   Chest wall: No tenderness.  ?Abdominal:  ?   General: Bowel sounds are normal.  ?   Palpations: Abdomen is soft.  ?Musculoskeletal:     ?   General: Normal range of motion.  ?   Cervical back: Neck supple.  ?   Right lower leg: No edema.  ?   Left lower leg: No edema.  ?Neurological:  ?   Mental Status: She is alert and oriented to person, place, and time.  ?Physical exam unchanged compared to previous office visit. ? ?Laboratory examination:  ? ? ?  Latest Ref Rng & Units 10/10/2021  ?  3:43 PM 07/28/2017  ?  1:57 PM 08/14/2016  ? 12:00 AM  ?CMP  ?Glucose 70 - 99 mg/dL 102   90     ?BUN 8 - 23 mg/dL 21   14     ?Creatinine 0.44 - 1.00 mg/dL 0.91   0.74   0.7       ?Sodium 135 - 145 mmol/L 142   139   145       ?Potassium 3.5 - 5.1 mmol/L 4.5   4.5   4.5       ?Chloride 98 - 111 mmol/L 107   104     ?CO2 22 - 32 mmol/L 30   28     ?Calcium 8.9 - 10.3 mg/dL 11.1   10.2     ?Total Protein 6.5 - 8.1 g/dL 7.6   7.2     ?Total Bilirubin 0.3 - 1.2 mg/dL 0.5   0.4     ?Alkaline Phos 38 - 126 U/L 142   101     ?AST 15 - 41 U/L _3 ?ALT 0 - 44 U/L 46   15   14       ?  ? This result is from an external source.  ? ? ?  Latest Ref Rng & Units 10/10/2021  ?  3:43 PM 07/28/2017  ?  1:57 PM  ?CBC  ?WBC 4.0 - 10.5 K/uL 9.1   7.0     ?Hemoglobin 12.0 - 15.0 g/dL 15.3   13.7    ?Hematocrit 36.0 - 46.0 % 45.9   42.2    ?Platelets 150 - 400 K/uL 253   258.0    ? ?Lipid Panel  ?   ?Component Value Date/Time  ? CHOL 166 04/11/2021 0822  ? TRIG 115 04/11/2021 0822  ? HDL 49 04/11/2021 0822  ? CHOLHDL 6 07/28/2017 1357  ? VLDL 29.0 07/28/2017 1357  ? [place] 96 04/11/2021 0822  ? ?HEMOGLOBIN A1C ?Lab Results  ?Component Value Date  ? HGBA1C 5.7 07/28/2017  ? ?TSH ?No results for input(s): TSH in the last 8760 hours. ? ?04/11/2021: Lipoprotein a 64.9 ? ?External labs:  ?11/19/2021: ?LDL 85, HDL 43, triglycerides 127, total cholesterol 151 ?BUN 15, creatinine 0.73, GFR >60, sodium 140, potassium 4.4 ?Hgb 14.8, HCT 44.4, MCV 90, platelet 213 ?Lipoprotein a normal 129 lipoprotein B normal 78 ?A1c 5.9% ? ?Labs 12/03/2020: ? ?LDL particle number markedly elevated at 1591 (improved from 1986 on 10/07/2018).  Total cholesterol 217, triglycerides 164, HDL 46, LDL 141.  LP-IR score 69, elevated.  Apo B/A- ratio 0.9, mildly elevated.  LP-PLA-2 is normal at 153.  CRP 1.47. ? ?Serum glucose _0 mg, BUN 12, creatinine 0.86, EGFR 74 mL, potassium 4.3, CMP otherwise normal. ? ?Hb 15.1/HCT 43.9, platelets 272. ? ?A1c 5.7%.  TSH normal at 1.220.  Vitamin D66.7. ? ?Allergies  ? ?Allergies  ?Allergen Reactions  ?? Antihistamines, Chlorpheniramine-Type Anxiety  ?  ?Medications Prior to Visit:  ? ?Outpatient Medications Prior to Visit  ?Medication Sig Dispense Refill  ?? aspirin EC 81 MG tablet Take 1 tablet (81 mg total) by mouth daily. Swallow whole. 90 tablet 3  ?? atorvastatin (LIPITOR) 40 MG tablet TAKE 1 TABLET BY MOUTH DAILY 90 tablet 1  ?? Coenzyme Q10 (COQ10) 100 MG CAPS Take 1 capsule by mouth daily.    ?? metoprolol succinate (TOPROL-XL) 25 MG 24 hr tablet Take 1 tablet (25 mg total) by mouth daily. 90 tablet 1  ?? NON FORMULARY Take 1 capsule by mouth daily. Vitamin K2 D3    ?? HYDROcodone-acetaminophen (NORCO) 7.5-325 MG tablet Take 1 tablet by mouth every 6  (six) hours as needed.    ?? traMADol (ULTRAM) 50 MG tablet tramadol 50 mg tablet ? Take 1 tablet every 6 hours by oral route as needed.    ? ?No facility-administered medications prior to visit.  ? ?Final Medic

## 2022-01-10 ENCOUNTER — Other Ambulatory Visit: Payer: Self-pay

## 2022-01-10 ENCOUNTER — Ambulatory Visit: Payer: Medicare Other | Admitting: Student

## 2022-01-10 ENCOUNTER — Ambulatory Visit
Admission: RE | Admit: 2022-01-10 | Discharge: 2022-01-10 | Disposition: A | Discharge status: Home / Self Care | Payer: Medicare Other | Source: Ambulatory Visit | Attending: Radiation Oncology | Admitting: Radiation Oncology

## 2022-01-10 ENCOUNTER — Ambulatory Visit: Payer: Medicare Other | Admitting: Radiation Oncology

## 2022-01-10 ENCOUNTER — Encounter: Payer: Self-pay | Admitting: Student

## 2022-01-10 VITALS — BP 135/69 | HR 73 | Temp 98.0°F | Resp 17 | Ht 66.0 in | Wt 171.2 lb

## 2022-01-10 DIAGNOSIS — C50411 Malignant neoplasm of upper-outer quadrant of right female breast: Secondary | ICD-10-CM | POA: Diagnosis not present

## 2022-01-10 DIAGNOSIS — E7800 Pure hypercholesterolemia, unspecified: Secondary | ICD-10-CM

## 2022-01-10 DIAGNOSIS — I10 Essential (primary) hypertension: Secondary | ICD-10-CM

## 2022-01-10 DIAGNOSIS — R931 Abnormal findings on diagnostic imaging of heart and coronary circulation: Secondary | ICD-10-CM

## 2022-01-10 LAB — RAD ONC ARIA SESSION SUMMARY
Course Elapsed Days: 15
Plan Fractions Treated to Date: 12
Plan Prescribed Dose Per Fraction: 2.66 Gy
Plan Total Fractions Prescribed: 16
Plan Total Prescribed Dose: 42.56 Gy
Reference Point Dosage Given to Date: 31.92 Gy
Reference Point Session Dosage Given: 2.66 Gy
Session Number: 12

## 2022-01-10 MED ORDER — RADIAPLEXRX EX GEL: Freq: Once | CUTANEOUS | Status: AC

## 2022-01-13 ENCOUNTER — Ambulatory Visit
Admission: RE | Admit: 2022-01-13 | Discharge: 2022-01-13 | Disposition: A | Discharge status: Home / Self Care | Payer: Medicare Other | Source: Ambulatory Visit | Attending: Radiation Oncology | Admitting: Radiation Oncology

## 2022-01-13 ENCOUNTER — Other Ambulatory Visit: Payer: Self-pay

## 2022-01-13 ENCOUNTER — Ambulatory Visit: Payer: Medicare Other | Attending: General Surgery

## 2022-01-13 VITALS — Wt 168.1 lb

## 2022-01-13 DIAGNOSIS — Z483 Aftercare following surgery for neoplasm: Secondary | ICD-10-CM

## 2022-01-13 DIAGNOSIS — C50411 Malignant neoplasm of upper-outer quadrant of right female breast: Secondary | ICD-10-CM | POA: Diagnosis not present

## 2022-01-13 LAB — RAD ONC ARIA SESSION SUMMARY
Course Elapsed Days: 18
Plan Fractions Treated to Date: 13
Plan Prescribed Dose Per Fraction: 2.66 Gy
Plan Total Fractions Prescribed: 16
Plan Total Prescribed Dose: 42.56 Gy
Reference Point Dosage Given to Date: 34.58 Gy
Reference Point Session Dosage Given: 2.66 Gy
Session Number: 13

## 2022-01-13 NOTE — Therapy (Signed)
?  OUTPATIENT PHYSICAL THERAPY [facility] SCREENING NOTE ? ? ?Patient Name: Maria Love ?MRN: [id number] ?DOB:1950-12-24, 71 y.o., female ?Today's Date: 01/13/2022 ? ?PCP: Willey Blade, MD ?REFERRING PROVIDER: Jovita Kussmaul, MD ? ? PT End of Session - 01/13/22 1525   ? ? Visit Number 2   # unchanged due to screen only  ? PT Start Time 1524   ? PT Stop Time 1528   ? PT Time Calculation (min) 4 min   ? Activity Tolerance Patient tolerated treatment well   ? Behavior During Therapy [facility] for tasks assessed/performed   ? ?  ?  ? ?  ? ? ?Past Medical History:  ?Diagnosis Date  ? Allergy 1973  ? Antihistamines  ? Arthritis 2003  ? Breast cancer ([place]) 09/17/2021  ? Hypertension   ? Skin cancer 2006  ? ?Past Surgical History:  ?Procedure Laterality Date  ? BREAST LUMPECTOMY WITH RADIOACTIVE SEED AND SENTINEL LYMPH NODE BIOPSY Right 10/31/2021  ? Procedure: RIGHT BREAST LUMPECTOMY WITH RADIOACTIVE SEED AND SENTINEL LYMPH NODE BIOPSY;  Surgeon: Jovita Kussmaul, MD;  Location: [facility];  Service: General;  Laterality: Right;  ? EYE SURGERY  2017  ? cataracts  ? FOOT SURGERY Left 2012  ? FRACTURE SURGERY  2014 and 2018  ? screw in left foot  ? ?Patient Active Problem List  ? Diagnosis Date Noted  ? Malignant neoplasm of upper-outer quadrant of right breast in female, estrogen receptor positive ([place]) 09/30/2021  ? Benign hypertension 07/28/2017  ? Cellulitis of face 07/28/2017  ? Contact dermatitis 07/28/2017  ? Foot pain 07/28/2017  ? Seasonal allergies 07/28/2017  ? Hx of migraines 07/28/2017  ? ? ?REFERRING DIAG: right breast cancer at risk for lymphedema ? ?THERAPY DIAG:  ?Aftercare following surgery for neoplasm ? ?PERTINENT HISTORY: The patient appears to have a small stage I cancer in the upper outer quadrant of the right breast..  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  She is s/p right breast lumpectomy with SLNB on 10/31/2021. 1 LN removed. She has a hx  of right shoulder disclocation many years ago.  ? ?PRECAUTIONS: right UE Lymphedema risk, None ? ?SUBJECTIVE: Pt returns for her 3 month L-Dex screen.  ? ?PAIN:  ?Are you having pain? No ? ?SOZO SCREENING: ?Patient was assessed today using the SOZO machine to determine the lymphedema index score. This was compared to her baseline score. It was determined that she is within the recommended range when compared to her baseline and no further action is needed at this time. She will continue SOZO screenings. These are done every 3 months for 2 years post operatively followed by every 6 months for 2 years, and then annually. ? ? ? ?Otelia Limes, PTA ?01/13/2022, 3:26 PM ? ?  ? ?

## 2022-01-14 ENCOUNTER — Other Ambulatory Visit: Payer: Self-pay

## 2022-01-14 ENCOUNTER — Ambulatory Visit
Admission: RE | Admit: 2022-01-14 | Discharge: 2022-01-14 | Disposition: A | Discharge status: Home / Self Care | Payer: Medicare Other | Source: Ambulatory Visit | Attending: Radiation Oncology | Admitting: Radiation Oncology

## 2022-01-14 DIAGNOSIS — C50411 Malignant neoplasm of upper-outer quadrant of right female breast: Secondary | ICD-10-CM | POA: Diagnosis not present

## 2022-01-14 LAB — RAD ONC ARIA SESSION SUMMARY
Course Elapsed Days: 19
Plan Fractions Treated to Date: 14
Plan Prescribed Dose Per Fraction: 2.66 Gy
Plan Total Fractions Prescribed: 16
Plan Total Prescribed Dose: 42.56 Gy
Reference Point Dosage Given to Date: 37.24 Gy
Reference Point Session Dosage Given: 2.66 Gy
Session Number: 14

## 2022-01-15 ENCOUNTER — Ambulatory Visit
Admission: RE | Admit: 2022-01-15 | Discharge: 2022-01-15 | Disposition: A | Discharge status: Home / Self Care | Payer: Medicare Other | Source: Ambulatory Visit | Attending: Radiation Oncology | Admitting: Radiation Oncology

## 2022-01-15 ENCOUNTER — Other Ambulatory Visit: Payer: Self-pay

## 2022-01-15 DIAGNOSIS — C50411 Malignant neoplasm of upper-outer quadrant of right female breast: Secondary | ICD-10-CM | POA: Diagnosis not present

## 2022-01-15 LAB — RAD ONC ARIA SESSION SUMMARY
Course Elapsed Days: 20
Plan Fractions Treated to Date: 15
Plan Prescribed Dose Per Fraction: 2.66 Gy
Plan Total Fractions Prescribed: 16
Plan Total Prescribed Dose: 42.56 Gy
Reference Point Dosage Given to Date: 39.9 Gy
Reference Point Session Dosage Given: 2.66 Gy
Session Number: 15

## 2022-01-16 ENCOUNTER — Other Ambulatory Visit: Payer: Self-pay

## 2022-01-16 ENCOUNTER — Ambulatory Visit
Admission: RE | Admit: 2022-01-16 | Discharge: 2022-01-16 | Disposition: A | Discharge status: Home / Self Care | Payer: Medicare Other | Source: Ambulatory Visit | Attending: Radiation Oncology | Admitting: Radiation Oncology

## 2022-01-16 DIAGNOSIS — C50411 Malignant neoplasm of upper-outer quadrant of right female breast: Secondary | ICD-10-CM | POA: Diagnosis not present

## 2022-01-16 LAB — RAD ONC ARIA SESSION SUMMARY
Course Elapsed Days: 21
Plan Fractions Treated to Date: 16
Plan Prescribed Dose Per Fraction: 2.66 Gy
Plan Total Fractions Prescribed: 16
Plan Total Prescribed Dose: 42.56 Gy
Reference Point Dosage Given to Date: 42.56 Gy
Reference Point Session Dosage Given: 2.66 Gy
Session Number: 16

## 2022-01-17 ENCOUNTER — Other Ambulatory Visit: Payer: Self-pay

## 2022-01-17 ENCOUNTER — Ambulatory Visit
Admission: RE | Admit: 2022-01-17 | Discharge: 2022-01-17 | Disposition: A | Discharge status: Home / Self Care | Payer: Medicare Other | Source: Ambulatory Visit | Attending: Radiation Oncology | Admitting: Radiation Oncology

## 2022-01-17 DIAGNOSIS — C50411 Malignant neoplasm of upper-outer quadrant of right female breast: Secondary | ICD-10-CM | POA: Diagnosis not present

## 2022-01-17 LAB — RAD ONC ARIA SESSION SUMMARY
Course Elapsed Days: 22
Plan Fractions Treated to Date: 1
Plan Prescribed Dose Per Fraction: 2 Gy
Plan Total Fractions Prescribed: 4
Plan Total Prescribed Dose: 8 Gy
Reference Point Dosage Given to Date: 44.56 Gy
Reference Point Session Dosage Given: 2 Gy
Session Number: 17

## 2022-01-20 ENCOUNTER — Other Ambulatory Visit: Payer: Self-pay

## 2022-01-20 ENCOUNTER — Ambulatory Visit
Admission: RE | Admit: 2022-01-20 | Discharge: 2022-01-20 | Disposition: A | Discharge status: Home / Self Care | Payer: Medicare Other | Source: Ambulatory Visit | Attending: Radiation Oncology | Admitting: Radiation Oncology

## 2022-01-20 DIAGNOSIS — Z170 Estrogen receptor positive status [ER+]: Secondary | ICD-10-CM | POA: Insufficient documentation

## 2022-01-20 DIAGNOSIS — C50411 Malignant neoplasm of upper-outer quadrant of right female breast: Secondary | ICD-10-CM | POA: Insufficient documentation

## 2022-01-20 LAB — RAD ONC ARIA SESSION SUMMARY
Course Elapsed Days: 25
Plan Fractions Treated to Date: 2
Plan Prescribed Dose Per Fraction: 2 Gy
Plan Total Fractions Prescribed: 4
Plan Total Prescribed Dose: 8 Gy
Reference Point Dosage Given to Date: 46.56 Gy
Reference Point Session Dosage Given: 2 Gy
Session Number: 18

## 2022-01-21 ENCOUNTER — Ambulatory Visit
Admission: RE | Admit: 2022-01-21 | Discharge: 2022-01-21 | Disposition: A | Discharge status: Home / Self Care | Payer: Medicare Other | Source: Ambulatory Visit | Attending: Radiation Oncology | Admitting: Radiation Oncology

## 2022-01-21 ENCOUNTER — Other Ambulatory Visit: Payer: Self-pay

## 2022-01-21 DIAGNOSIS — C50411 Malignant neoplasm of upper-outer quadrant of right female breast: Secondary | ICD-10-CM | POA: Diagnosis not present

## 2022-01-21 LAB — RAD ONC ARIA SESSION SUMMARY
Course Elapsed Days: 26
Plan Fractions Treated to Date: 3
Plan Prescribed Dose Per Fraction: 2 Gy
Plan Total Fractions Prescribed: 4
Plan Total Prescribed Dose: 8 Gy
Reference Point Dosage Given to Date: 48.56 Gy
Reference Point Session Dosage Given: 2 Gy
Session Number: 19

## 2022-01-22 ENCOUNTER — Encounter: Payer: Self-pay | Admitting: *Deleted

## 2022-01-22 ENCOUNTER — Encounter: Payer: Self-pay | Admitting: Radiation Oncology

## 2022-01-22 ENCOUNTER — Encounter: Payer: Self-pay | Admitting: Hematology

## 2022-01-22 ENCOUNTER — Ambulatory Visit
Admission: RE | Admit: 2022-01-22 | Discharge: 2022-01-22 | Disposition: A | Discharge status: Home / Self Care | Payer: Medicare Other | Source: Ambulatory Visit | Attending: Radiation Oncology | Admitting: Radiation Oncology

## 2022-01-22 ENCOUNTER — Other Ambulatory Visit: Payer: Self-pay

## 2022-01-22 ENCOUNTER — Inpatient Hospital Stay: Payer: Medicare Other | Attending: Hematology | Admitting: Hematology

## 2022-01-22 VITALS — BP 132/55 | HR 70 | Temp 97.9°F | Resp 18 | Ht 66.0 in | Wt 171.0 lb

## 2022-01-22 DIAGNOSIS — C50411 Malignant neoplasm of upper-outer quadrant of right female breast: Secondary | ICD-10-CM | POA: Insufficient documentation

## 2022-01-22 DIAGNOSIS — M8580 Other specified disorders of bone density and structure, unspecified site: Secondary | ICD-10-CM | POA: Diagnosis not present

## 2022-01-22 DIAGNOSIS — Z170 Estrogen receptor positive status [ER+]: Secondary | ICD-10-CM | POA: Diagnosis not present

## 2022-01-22 DIAGNOSIS — I10 Essential (primary) hypertension: Secondary | ICD-10-CM | POA: Insufficient documentation

## 2022-01-22 LAB — RAD ONC ARIA SESSION SUMMARY
Course Elapsed Days: 27
Plan Fractions Treated to Date: 4
Plan Prescribed Dose Per Fraction: 2 Gy
Plan Total Fractions Prescribed: 4
Plan Total Prescribed Dose: 8 Gy
Reference Point Dosage Given to Date: 50.56 Gy
Reference Point Session Dosage Given: 2 Gy
Session Number: 20

## 2022-01-22 MED ORDER — TAMOXIFEN CITRATE 20 MG PO TABS: 20.0000 mg | ORAL_TABLET | Freq: Every day | ORAL | 3 refills | Status: DC

## 2022-01-22 NOTE — Progress Notes (Signed)
?[facility]   ?[phone] [phone]   ?Clinic Follow up Note  ? ?Patient Care Team: ?Willey Blade, MD as PCP - General (Internal Medicine) ?Adrian Prows, MD as Consulting Physician (Cardiology) ?Kyung Rudd, MD as Consulting Physician (Radiation Oncology) ?Truitt Merle, MD as Consulting Physician (Hematology) ?Jovita Kussmaul, MD as Consulting Physician (General Surgery) ?Melrose Nakayama, MD as Consulting Physician (Cardiothoracic Surgery) ?Mauro Kaufmann, RN as Oncology Nurse Navigator ?Rockwell Germany, RN as Oncology Nurse Navigator ? ?Date of Service:  01/22/2022 ? ?CHIEF COMPLAINT: f/u of right breast cancer ? ?CURRENT THERAPY:  ?To start tamoxifen ? ?ASSESSMENT & PLAN:  ?Maria Love is a 71 y.o. post-menopausal female with  ? ?1. 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 S/p right lumpectomy on 10/31/21 under Dr. Marlou Starks, path showed 2.1 cm invasive lobular carcinoma with focal LCIS. Margins and lymph node negative. ?-Oncotype RS of 14, low risk, no need adjuvant chemotherapy. ?-she is scheduled to complete adjuvant radiation today, started 12/26/21 under Dr. Lisbeth Renshaw.  She is tolerating well. ?-Given the strong ER and PR expression in her postmenopausal status, I recommend adjuvant endocrine therapy for a total of 5-10 years to reduce the risk of cancer recurrence. In her case, I recommend tamoxifen due to significant osteopenia and some arthralgia. The potential side effects, which includes but not limited to, hot flash, skin and vaginal dryness, slightly increased risk of cardiovascular disease and cataract, small risk of thrombosis and endometrial cancer, were discussed with her in great details. Preventive strategies for thrombosis, such as being physically active, using compression stocks, avoid cigarette smoking, etc., were reviewed  with her.  She is on Eliquis for atrial fibrillation, which prevents thrombosis.  I also recommend her to follow-up with her gynecologist once a year, and watch for vaginal spotting or bleeding, as a clinically sign of endometrial cancer, etc. She voiced good understanding, and agrees to proceed. Will start in a few weeks. ?-Due to her lobular histology, I recommend her to take tamoxifen for 10 years if she tolerates well. ?-We also discussed the breast cancer surveillance after her surgery. She will continue annual screening mammogram, self exam, and a routine office visit with lab and exam. She notes she is moving to [place], [place] towards the end of the summer. She asked about transferring her care to an oncologist there; I will ask my colleague, Dr. Chryl Heck, for recommendations. ?-I encouraged her to have healthy diet and exercise regularly.  ?  ?2. Osteopenia ?-Her most recent DEXA was 09/20/21 showing osteopenia (T-score -1.7) with high risk for fracture. ?-She is on calcium and vitamin D ?-Tamoxifen will likely increase her bone density. ?  ?  ?PLAN:  ?-start tamoxifen in a few weeks, I called in today  ?-I will referral her to the breast program at [facility], she will move to [place] in late July 2023. ?-survivorship in 2-3 months, before she moves. ? ? ?No problem-specific Assessment & Plan notes found for this encounter. ? ? ?SUMMARY OF ONCOLOGIC HISTORY: ?Oncology History Overview Note  ? Cancer Staging  ?Malignant neoplasm of upper-outer quadrant of right breast in female, estrogen receptor positive ([place]) ?Staging form: Breast, AJCC 8th Edition ?- Clinical stage from 10/07/2021: Stage IA (cT1b, cN0, cM0, G2, ER+, PR+, HER2-) - Signed by Truitt Merle, MD on 10/10/2021 ?Method of lymph node assessment: Clinical ?Histologic grading system:  3 grade system ?- Pathologic stage from 10/31/2021: Stage IA (pT2, pN0, cM0, G2, ER+, PR+, HER2-, Oncotype DX score: 14) - Signed by Truitt Merle, MD on  01/21/2022 ?Stage prefix: Initial diagnosis ?Multigene prognostic tests performed: Oncotype DX ?Recurrence score range: Greater than or equal to 11 ?Histologic grading system: 3 grade system ?Residual tumor (R): R0 - None ? ? ?  ?Malignant neoplasm of upper-outer quadrant of right breast in female, estrogen receptor positive ([facility])  ?09/05/2021 Mammogram  ? CLINICAL DATA:  Screening recall for a possible right breast mass as well as right breast calcifications and possible asymmetry. ?  ?EXAM: ?DIGITAL DIAGNOSTIC UNILATERAL RIGHT MAMMOGRAM WITH TOMOSYNTHESIS AND CAD; ULTRASOUND RIGHT BREAST LIMITED ? ?IMPRESSION: ?1. Highly suspicious 6 mm mass in the right breast at 11 o'clock. ?Tissue sampling is recommended. No evidence of right axillary ?metastatic lymphadenopathy. ?2. Scattered benign bilateral breast calcifications. No suspicious ?calcifications. ?  ?09/17/2021 Initial Biopsy  ? Diagnosis ?Breast, right, needle core biopsy, 11 o'clock ?- INVASIVE MAMMARY CARCINOMA, GRADE 2, RANGING TO 0.4 CM IN MAXIMUM EXTENT. ?- THREE OF THREE BIOPSY FRAGMENTS INVOLVED. ? ?ADDENDUM: Negative E-Cadherin immunostaining within the tumor supports a lobular phenotype ? ?PROGNOSTIC INDICATORS ?Results: ?The tumor cells are NEGATIVE for Her2 (1+). ?Estrogen Receptor: 95%, POSITIVE, STRONG STAINING INTENSITY ?Progesterone Receptor: 100%, POSITIVE, STRONG STAINING INTENSITY ?Proliferation Marker Ki67: 15% ?  ?09/30/2021 Initial Diagnosis  ? Malignant neoplasm of upper-outer quadrant of right breast in female, estrogen receptor positive ([place]) ? ?  ?10/07/2021 Cancer Staging  ? Staging form: Breast, AJCC 8th Edition ?- Clinical stage from 10/07/2021: Stage IA (cT1b, cN0, cM0, G2, ER+, PR+, HER2-) - Signed by Truitt Merle, MD on 10/10/2021 ?Method of lymph node assessment: Clinical ?Histologic grading system: 3 grade system ? ?  ?10/31/2021 Cancer Staging  ? Staging form: Breast, AJCC 8th Edition ?- Pathologic stage from 10/31/2021: Stage IA (pT2, pN0,  cM0, G2, ER+, PR+, HER2-, Oncotype DX score: 14) - Signed by Truitt Merle, MD on 01/21/2022 ?Stage prefix: Initial diagnosis ?Multigene prognostic tests performed: Oncotype DX ?Recurrence score range: Greater than or equal to 11 ?Histologic grading system: 3 grade system ?Residual tumor (R): R0 - None ? ?  ?10/31/2021 Definitive Surgery  ? FINAL MICROSCOPIC DIAGNOSIS:  ? ?A. LYMPH NODE, RIGHT AXILLA, SENTINEL, EXCISION:  ?- Lymph node, negative for carcinoma (0/1)  ? ?B. BREAST, RIGHT, LUMPECTOMY:  ?- Invasive lobular carcinoma, 2.1 cm, grade 2.  See comment  ?- Focal lobular carcinoma in situ  ?- Resection margins are negative for carcinoma - closest is the inferior margin at 0.5 cm  ?- Benign unremarkable skin  ?- Biopsy site changes  ?- See oncology table  ?  ?10/31/2021 Miscellaneous  ? Oncotype DX was obtained on the final surgical sample and the recurrence score of 14 predicts a risk of recurrence outside the breast over the next 9 years of 4%, if the patient's only systemic therapy is an antiestrogen for 5 years.  It also predicts no benefit from chemotherapy.  ?  ? ? ? ?INTERVAL HISTORY:  ?Celestial Barnfield is here for a follow up of breast cancer. She was last seen by me on 10/10/21 in consultation. She presents to the clinic alone. ?She reports she has done very well with surgery and radiation therapy. ?  ?All other systems were reviewed with the patient and are negative. ? ?MEDICAL HISTORY:  ?Past Medical History:  ?Diagnosis Date  ? Allergy 1973  ? Antihistamines  ? Arthritis 2003  ?  Breast cancer ([facility]) 09/17/2021  ? Hypertension   ? Skin cancer 2006  ? ? ?SURGICAL HISTORY: ?Past Surgical History:  ?Procedure Laterality Date  ? BREAST LUMPECTOMY WITH RADIOACTIVE SEED AND SENTINEL LYMPH NODE BIOPSY Right 10/31/2021  ? Procedure: RIGHT BREAST LUMPECTOMY WITH RADIOACTIVE SEED AND SENTINEL LYMPH NODE BIOPSY;  Surgeon: Jovita Kussmaul, MD;  Location: [facility];  Service: General;  Laterality: Right;  ?  EYE SURGERY  2017  ? cataracts  ? FOOT SURGERY Left 2012  ? FRACTURE SURGERY  2014 and 2018  ? screw in left foot  ? ? ?I have reviewed the social history and family history with the patient and they are u

## 2022-01-23 ENCOUNTER — Other Ambulatory Visit: Payer: Self-pay

## 2022-01-23 DIAGNOSIS — C50411 Malignant neoplasm of upper-outer quadrant of right female breast: Secondary | ICD-10-CM

## 2022-01-23 NOTE — Progress Notes (Signed)
Pt relocating to [place], [place].  Verbal order from Dr. Burr Medico to sent referral to [facility] [phone]  F [phone]).  Dr. Burr Medico preferred Breast Cancer Providers Dr. Juleen China or Dr. Milon Dikes if they were accepting new patients.  Faxed Dr. Ernestina Penna last office note, pt demographics, and referral to [facility]  [place].  Fax confirmation received.  ?

## 2022-01-29 ENCOUNTER — Other Ambulatory Visit: Payer: Self-pay | Admitting: Student

## 2022-01-29 DIAGNOSIS — E7800 Pure hypercholesterolemia, unspecified: Secondary | ICD-10-CM

## 2022-02-11 NOTE — Progress Notes (Addendum)
                                                                                                                                                             Patient Name: Maria Love MRN: [id number] DOB: [birth year] Referring Physician: Truitt Merle (Profile Not Attached) Date of Service: 01/22/2022 [facility], [place]                                                        End Of Treatment Note  Diagnoses: C50.411-Malignant neoplasm of upper-outer quadrant of right female breast  Cancer Staging:   Stage IA, pT1cN0M0 grade 2, ER/PR positive invasive lobular carcinoma of the right breast.  Intent: Curative  Radiation Treatment Dates: 12/26/2021 through 01/22/2022 Site Technique Total Dose (Gy) Dose per Fx (Gy) Completed Fx Beam Energies  Breast, Right: Breast_R 3D 42.56/42.56 2.66 16/16 6X  Breast, Right: Breast_R_Bst 3D 8/8 2 4/4 6X, 10X   Narrative: The patient tolerated radiation therapy relatively well. She developed fatigue and anticipated skin changes in the treatment field.   Plan: The patient will receive a call in about one month from the radiation oncology department. She will continue follow up with Dr. Burr Medico as well.   ________________________________________________    Carola Rhine, [facility]

## 2022-02-24 ENCOUNTER — Other Ambulatory Visit: Payer: Self-pay | Admitting: Thoracic Surgery (Cardiothoracic Vascular Surgery)

## 2022-02-24 DIAGNOSIS — R911 Solitary pulmonary nodule: Secondary | ICD-10-CM

## 2022-03-03 ENCOUNTER — Ambulatory Visit
Admission: RE | Admit: 2022-03-03 | Discharge: 2022-03-03 | Disposition: A | Discharge status: Home / Self Care | Payer: Medicare Other | Source: Ambulatory Visit | Attending: Adult Health | Admitting: Adult Health

## 2022-03-03 DIAGNOSIS — Z170 Estrogen receptor positive status [ER+]: Secondary | ICD-10-CM | POA: Insufficient documentation

## 2022-03-03 DIAGNOSIS — C50411 Malignant neoplasm of upper-outer quadrant of right female breast: Secondary | ICD-10-CM | POA: Insufficient documentation

## 2022-03-03 NOTE — Progress Notes (Signed)
  Radiation Oncology         [phone] ________________________________  Name: Maria Love MRN: [id number]  Date of Service: 03/03/2022  DOB: 05/14/1951  Post Treatment Telephone Note  Diagnosis:   Stage IA, pT1cN0M0 grade 2, ER/PR positive invasive lobular carcinoma of the right breast.  Intent: Curative  Radiation Treatment Dates: 12/26/2021 through 01/22/2022 Site Technique Total Dose (Gy) Dose per Fx (Gy) Completed Fx Beam Energies  Breast, Right: Breast_R 3D 42.56/42.56 2.66 16/16 6X  Breast, Right: Breast_R_Bst 3D 8/8 2 4/4 6X, 10X   Narrative: The patient tolerated radiation therapy relatively well. She developed fatigue and anticipated skin changes in the treatment field both of which have improved.  Impression/Plan: 1. Stage IA, pT1cN0M0 grade 2, ER/PR positive invasive lobular carcinoma of the right breast.. The patient has been doing well since completion of radiotherapy. We discussed that we would be happy to continue to follow her as needed, but she will also continue to follow up with Dr. Burr Medico in medical oncology. She was counseled on skin care as well as measures to avoid sun exposure to this area.       Carola Rhine, PAC

## 2022-03-24 ENCOUNTER — Ambulatory Visit: Payer: Medicare Other | Attending: General Surgery

## 2022-03-24 VITALS — Wt 174.1 lb

## 2022-03-24 DIAGNOSIS — Z483 Aftercare following surgery for neoplasm: Secondary | ICD-10-CM | POA: Insufficient documentation

## 2022-03-24 NOTE — Therapy (Addendum)
  OUTPATIENT PHYSICAL THERAPY SOZO SCREENING NOTE   Patient Name: Maria Love MRN: [id number] DOB:[birth year], 71 y.o., female Today's Date: 03/24/2022  PCP: Willey Blade, MD REFERRING PROVIDER: Jovita Kussmaul, MD   PT End of Session - 03/24/22 1609     Visit Number 2   # unchanged due to screen only   PT Start Time 1506    PT Stop Time 8502    PT Time Calculation (min) 5 min    Activity Tolerance Patient tolerated treatment well    Behavior During Therapy [facility] for tasks assessed/performed             Past Medical History:  Diagnosis Date   Allergy 1973   Antihistamines   Arthritis 2003   Breast cancer ([facility]) 09/17/2021   Hypertension    Skin cancer 2006   Past Surgical History:  Procedure Laterality Date   BREAST LUMPECTOMY WITH RADIOACTIVE SEED AND SENTINEL LYMPH NODE BIOPSY Right 10/31/2021   Procedure: RIGHT BREAST LUMPECTOMY WITH RADIOACTIVE SEED AND SENTINEL LYMPH NODE BIOPSY;  Surgeon: Autumn Messing III, MD;  Location: [facility];  Service: General;  Laterality: Right;   EYE SURGERY  2017   cataracts   FOOT SURGERY Left 2012   FRACTURE SURGERY  2014 and 2018   screw in left foot   Patient Active Problem List   Diagnosis Date Noted   Malignant neoplasm of upper-outer quadrant of right breast in female, estrogen receptor positive ([place]) 09/30/2021   Benign hypertension 07/28/2017   Cellulitis of face 07/28/2017   Contact dermatitis 07/28/2017   Foot pain 07/28/2017   Seasonal allergies 07/28/2017   Hx of migraines 07/28/2017    REFERRING DIAG: right breast cancer at risk for lymphedema  THERAPY DIAG: Aftercare following surgery for neoplasm  PERTINENT HISTORY: The patient appears to have a small stage I cancer in the upper outer quadrant of the right breast..  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  She is s/p right breast lumpectomy with SLNB on 10/31/2021. 1 LN removed. She has a hx of  right shoulder disclocation many years ago.   PRECAUTIONS: right UE Lymphedema risk, None  SUBJECTIVE: Pt returns for her 3 month L-Dex screen. "I'm moving to [place] so you can D/C me. I hope to pick up doing these when I move. I've already contacted the [facility] in the area."  PAIN:  Are you having pain? No  SOZO SCREENING: Patient was assessed today using the SOZO machine to determine the lymphedema index score. This was compared to her baseline score. It was determined that she is within the recommended range when compared to her baseline and no further action is needed at this time. She will continue SOZO screenings. These are done every 3 months for 2 years post operatively followed by every 6 months for 2 years, and then annually.  P: As pt reports she is moving to [place] we will close her episode as she is planning to cont her care there.   Otelia Limes, PTA 03/24/2022, 4:11 PM

## 2022-04-02 ENCOUNTER — Encounter: Payer: Self-pay | Admitting: *Deleted

## 2022-04-02 DIAGNOSIS — C50411 Malignant neoplasm of upper-outer quadrant of right female breast: Secondary | ICD-10-CM

## 2022-04-03 ENCOUNTER — Encounter: Payer: Self-pay | Admitting: Adult Health

## 2022-04-03 ENCOUNTER — Other Ambulatory Visit: Payer: Self-pay

## 2022-04-03 ENCOUNTER — Inpatient Hospital Stay: Payer: Medicare Other

## 2022-04-03 ENCOUNTER — Inpatient Hospital Stay: Payer: Medicare Other | Attending: Hematology | Admitting: Adult Health

## 2022-04-03 VITALS — BP 123/43 | HR 69 | Temp 99.0°F | Resp 18 | Ht 66.0 in | Wt 176.5 lb

## 2022-04-03 DIAGNOSIS — Z170 Estrogen receptor positive status [ER+]: Secondary | ICD-10-CM

## 2022-04-03 DIAGNOSIS — Z808 Family history of malignant neoplasm of other organs or systems: Secondary | ICD-10-CM | POA: Diagnosis not present

## 2022-04-03 DIAGNOSIS — M8580 Other specified disorders of bone density and structure, unspecified site: Secondary | ICD-10-CM | POA: Insufficient documentation

## 2022-04-03 DIAGNOSIS — Z87891 Personal history of nicotine dependence: Secondary | ICD-10-CM | POA: Insufficient documentation

## 2022-04-03 DIAGNOSIS — C50411 Malignant neoplasm of upper-outer quadrant of right female breast: Secondary | ICD-10-CM | POA: Insufficient documentation

## 2022-04-03 DIAGNOSIS — I10 Essential (primary) hypertension: Secondary | ICD-10-CM | POA: Diagnosis not present

## 2022-04-03 DIAGNOSIS — Z800 Family history of malignant neoplasm of digestive organs: Secondary | ICD-10-CM | POA: Diagnosis not present

## 2022-04-03 LAB — CBC WITH DIFFERENTIAL/PLATELET
Abs Immature Granulocytes: 0.02 10*3/uL (ref 0.00–0.07)
Basophils Absolute: 0.1 10*3/uL (ref 0.0–0.1)
Basophils Relative: 1 %
Eosinophils Absolute: 0.1 10*3/uL (ref 0.0–0.5)
Eosinophils Relative: 1 %
HCT: 42.8 % (ref 36.0–46.0)
Hemoglobin: 14.7 g/dL (ref 12.0–15.0)
Immature Granulocytes: 0 %
Lymphocytes Relative: 20 %
Lymphs Abs: 1.6 10*3/uL (ref 0.7–4.0)
MCH: 30.5 pg (ref 26.0–34.0)
MCHC: 34.3 g/dL (ref 30.0–36.0)
MCV: 88.8 fL (ref 80.0–100.0)
Monocytes Absolute: 0.8 10*3/uL (ref 0.1–1.0)
Monocytes Relative: 10 %
Neutro Abs: 5.5 10*3/uL (ref 1.7–7.7)
Neutrophils Relative %: 68 %
Platelets: 201 10*3/uL (ref 150–400)
RBC: 4.82 MIL/uL (ref 3.87–5.11)
RDW: 12.6 % (ref 11.5–15.5)
WBC: 8.1 10*3/uL (ref 4.0–10.5)
nRBC: 0 % (ref 0.0–0.2)

## 2022-04-03 LAB — COMPREHENSIVE METABOLIC PANEL
ALT: 20 U/L (ref 0–44)
AST: 21 U/L (ref 15–41)
Albumin: 4.1 g/dL (ref 3.5–5.0)
Alkaline Phosphatase: 81 U/L (ref 38–126)
Anion gap: 5 (ref 5–15)
BUN: 19 mg/dL (ref 8–23)
CO2: 29 mmol/L (ref 22–32)
Calcium: 10.1 mg/dL (ref 8.9–10.3)
Chloride: 105 mmol/L (ref 98–111)
Creatinine, Ser: 1 mg/dL (ref 0.44–1.00)
GFR, Estimated: >60 mL/min (ref >60)
Glucose, Bld: 87 mg/dL (ref 70–99)
Potassium: 4.5 mmol/L (ref 3.5–5.1)
Sodium: 139 mmol/L (ref 135–145)
Total Bilirubin: 0.5 mg/dL (ref 0.3–1.2)
Total Protein: 7.1 g/dL (ref 6.5–8.1)

## 2022-04-03 NOTE — Progress Notes (Signed)
SURVIVORSHIP VISIT:   BRIEF ONCOLOGIC HISTORY:  Oncology History Overview Note   Cancer Staging  Malignant neoplasm of upper-outer quadrant of right breast in female, estrogen receptor positive ([place]) Staging form: Breast, AJCC 8th Edition - Clinical stage from 10/07/2021: Stage IA (cT1b, cN0, cM0, G2, ER+, PR+, HER2-) - Signed by Truitt Merle, MD on 10/10/2021 Method of lymph node assessment: Clinical Histologic grading system: 3 grade system - Pathologic stage from 10/31/2021: Stage IA (pT2, pN0, cM0, G2, ER+, PR+, HER2-, Oncotype DX score: 14) - Signed by Truitt Merle, MD on 01/21/2022 Stage prefix: Initial diagnosis Multigene prognostic tests performed: Oncotype DX Recurrence score range: Greater than or equal to 11 Histologic grading system: 3 grade system Residual tumor (R): R0 - None     Malignant neoplasm of upper-outer quadrant of right breast in female, estrogen receptor positive ([place])  09/05/2021 Mammogram   CLINICAL DATA:  Screening recall for a possible right breast mass as well as right breast calcifications and possible asymmetry.   EXAM: DIGITAL DIAGNOSTIC UNILATERAL RIGHT MAMMOGRAM WITH TOMOSYNTHESIS AND CAD; ULTRASOUND RIGHT BREAST LIMITED  IMPRESSION: 1. Highly suspicious 6 mm mass in the right breast at 11 o'clock. Tissue sampling is recommended. No evidence of right axillary metastatic lymphadenopathy. 2. Scattered benign bilateral breast calcifications. No suspicious calcifications.   09/17/2021 Initial Biopsy   Diagnosis Breast, right, needle core biopsy, 11 o'clock - INVASIVE MAMMARY CARCINOMA, GRADE 2, RANGING TO 0.4 CM IN MAXIMUM EXTENT. - THREE OF THREE BIOPSY FRAGMENTS INVOLVED.  ADDENDUM: Negative E-Cadherin immunostaining within the tumor supports a lobular phenotype  PROGNOSTIC INDICATORS Results: The tumor cells are NEGATIVE for Her2 (1+). Estrogen Receptor: 95%, POSITIVE, STRONG STAINING INTENSITY Progesterone Receptor: 100%, POSITIVE, STRONG STAINING  INTENSITY Proliferation Marker Ki67: 15%   09/30/2021 Initial Diagnosis   Malignant neoplasm of upper-outer quadrant of right breast in female, estrogen receptor positive ([place])   10/07/2021 Cancer Staging   Staging form: Breast, AJCC 8th Edition - Clinical stage from 10/07/2021: Stage IA (cT1b, cN0, cM0, G2, ER+, PR+, HER2-) - Signed by Truitt Merle, MD on 10/10/2021 Method of lymph node assessment: Clinical Histologic grading system: 3 grade system   10/31/2021 Cancer Staging   Staging form: Breast, AJCC 8th Edition - Pathologic stage from 10/31/2021: Stage IA (pT2, pN0, cM0, G2, ER+, PR+, HER2-, Oncotype DX score: 14) - Signed by Truitt Merle, MD on 01/21/2022 Stage prefix: Initial diagnosis Multigene prognostic tests performed: Oncotype DX Recurrence score range: Greater than or equal to 11 Histologic grading system: 3 grade system Residual tumor (R): R0 - None   10/31/2021 Definitive Surgery   FINAL MICROSCOPIC DIAGNOSIS:   A. LYMPH NODE, RIGHT AXILLA, SENTINEL, EXCISION:  - Lymph node, negative for carcinoma (0/1)   B. BREAST, RIGHT, LUMPECTOMY:  - Invasive lobular carcinoma, 2.1 cm, grade 2.  See comment  - Focal lobular carcinoma in situ  - Resection margins are negative for carcinoma - closest is the inferior margin at 0.5 cm  - Benign unremarkable skin  - Biopsy site changes  - See oncology table    10/31/2021 Miscellaneous   Oncotype DX was obtained on the final surgical sample and the recurrence score of 14 predicts a risk of recurrence outside the breast over the next 9 years of 4%, if the patient's only systemic therapy is an antiestrogen for 5 years.  It also predicts no benefit from chemotherapy.    12/26/2021 - 01/22/2022 Radiation Therapy   Site Technique Total Dose (Gy) Dose per Fx (Gy) Completed Fx  Beam Energies  Breast, Right: Breast_R 3D 42.56/42.56 2.66 16/16 6X  Breast, Right: Breast_R_Bst 3D 8/8 2 4/4 6X, 10X       INTERVAL HISTORY:  Maria Love to review her  survivorship care plan detailing her treatment course for breast cancer, as well as monitoring long-term side effects of that treatment, education regarding health maintenance, screening, and overall wellness and health promotion.     Overall, Maria Love reports feeling quite well.  She is taking tamoxifen daily with good tolerance.    REVIEW OF SYSTEMS:  Review of Systems  Constitutional:  Negative for appetite change, chills, fatigue, fever and unexpected weight change.  HENT:   Negative for hearing loss, lump/mass and trouble swallowing.   Eyes:  Negative for eye problems and icterus.  Respiratory:  Negative for chest tightness, cough and shortness of breath.   Cardiovascular:  Negative for chest pain, leg swelling and palpitations.  Gastrointestinal:  Negative for abdominal distention, abdominal pain, constipation, diarrhea, nausea and vomiting.  Endocrine: Negative for hot flashes.  Genitourinary:  Negative for difficulty urinating.   Musculoskeletal:  Negative for arthralgias.  Skin:  Negative for itching and rash.  Neurological:  Negative for dizziness, extremity weakness, headaches and numbness.  Hematological:  Negative for adenopathy. Does not bruise/bleed easily.  Psychiatric/Behavioral:  Negative for depression. The patient is not nervous/anxious.    Breast: Denies any new nodularity, masses, tenderness, nipple changes, or nipple discharge.      ONCOLOGY TREATMENT TEAM:  1. Surgeon:  Dr. Marlou Starks at [facility] 2. Medical Oncologist: Dr. Burr Medico  3. Radiation Oncologist: Dr. Lisbeth Renshaw    PAST MEDICAL/SURGICAL HISTORY:  Past Medical History:  Diagnosis Date   Allergy 1973   Antihistamines   Arthritis 2003   Breast cancer ([place]) 09/17/2021   Hypertension    Skin cancer 2006   Past Surgical History:  Procedure Laterality Date   BREAST LUMPECTOMY WITH RADIOACTIVE SEED AND SENTINEL LYMPH NODE BIOPSY Right 10/31/2021   Procedure: RIGHT BREAST LUMPECTOMY WITH  RADIOACTIVE SEED AND SENTINEL LYMPH NODE BIOPSY;  Surgeon: Jovita Kussmaul, MD;  Location: [facility];  Service: General;  Laterality: Right;   EYE SURGERY  2017   cataracts   FOOT SURGERY Left 2012   FRACTURE SURGERY  2014 and 2018   screw in left foot     ALLERGIES:  Allergies  Allergen Reactions   Antihistamines, Chlorpheniramine-Type Anxiety     CURRENT MEDICATIONS:  Outpatient Encounter Medications as of 04/03/2022  Medication Sig   aspirin EC 81 MG tablet Take 1 tablet (81 mg total) by mouth daily. Swallow whole.   atorvastatin (LIPITOR) 40 MG tablet TAKE 1 TABLET BY MOUTH EVERY DAY   Coenzyme Q10 (COQ10) 100 MG CAPS Take 1 capsule by mouth daily.   metoprolol succinate (TOPROL-XL) 25 MG 24 hr tablet Take 1 tablet (25 mg total) by mouth daily.   NON FORMULARY Take 1 capsule by mouth daily. Vitamin K2 D3   tamoxifen (NOLVADEX) 20 MG tablet Take 1 tablet (20 mg total) by mouth daily.   No facility-administered encounter medications on file as of 04/03/2022.     ONCOLOGIC FAMILY HISTORY:  Family History  Problem Relation Age of Onset   Arthritis Mother    Hypertension Mother    Hyperlipidemia Mother    Mitral valve prolapse Mother    Cancer Father 37       pancreatic cancer   Pancreatic cancer Father    Arthritis Sister    Hyperlipidemia  Sister    Hypertension Sister    Hyperlipidemia Sister    Cancer Paternal Uncle        colon cancer   Arthritis Maternal Grandmother    Hypertension Maternal Grandmother    Stroke Maternal Grandmother    Diabetes Maternal Grandfather    Arthritis Paternal Grandmother    Macular degeneration Paternal Grandmother    Heart disease Paternal Grandmother    Thyroid cancer Son    Breast cancer Neg Hx       SOCIAL HISTORY:  Social History   Socioeconomic History   Marital status: Single    Spouse name: Not on file   Number of children: 1   Years of education: Not on file   Highest education level: Not on file   Occupational History   Not on file  Tobacco Use   Smoking status: Former    Packs/day: 0.25    Years: 0.50    Total pack years: 0.13    Types: Cigarettes    Quit date: 01/31/1973    Years since quitting: 49.2   Smokeless tobacco: Never   Tobacco comments:    Mostly passive,  smoked some with college friends  Vaping Use   Vaping Use: Never used  Substance and Sexual Activity   Alcohol use: No   Drug use: Never   Sexual activity: Not Currently    Birth control/protection: None    Comment: I am 84, have not been active for more  than 40 years  Other Topics Concern   Not on file  Social History Narrative   Not on file   Social Determinants of Health   Financial Resource Strain: Not on file  Food Insecurity: Not on file  Transportation Needs: Not on file  Physical Activity: Not on file  Stress: Not on file  Social Connections: Not on file  Intimate Partner Violence: Not At Risk (10/10/2021)   Humiliation, Afraid, Rape, and Kick questionnaire    Fear of Current or Ex-Partner: No    Emotionally Abused: No    Physically Abused: No    Sexually Abused: No     OBSERVATIONS/OBJECTIVE:  BP (!) 123/43 (BP Location: Left Arm, Patient Position: Sitting)   Pulse 69   Temp 99 F (37.2 C) (Tympanic)   Resp 18   Ht _0  (1.676 m)   Wt 176 lb 8 oz (80.1 kg)   LMP  (LMP Unknown)   SpO2 98%   BMI 28.49 kg/m  GENERAL: Patient is a well appearing female in no acute distress HEENT:  Sclerae anicteric.  Oropharynx clear and moist. No ulcerations or evidence of oropharyngeal candidiasis. Neck is supple.  NODES:  No cervical, supraclavicular, or axillary lymphadenopathy palpated.  BREAST EXAM:  right breast s/p lumpectomy, radiation, no sign of local recurrence, left breast benign LUNGS:  Clear to auscultation bilaterally.  No wheezes or rhonchi. HEART:  Regular rate and rhythm. No murmur appreciated. ABDOMEN:  Soft, nontender.  Positive, normoactive bowel sounds. No organomegaly  palpated. MSK:  No focal spinal tenderness to palpation. Full range of motion bilaterally in the upper extremities. EXTREMITIES:  No peripheral edema.   SKIN:  Clear with no obvious rashes or skin changes. No nail dyscrasia. NEURO:  Nonfocal. Well oriented.  Appropriate affect.   LABORATORY DATA:  None for this visit.  DIAGNOSTIC IMAGING:  None for this visit.      ASSESSMENT AND PLAN:  Ms.. Love is a pleasant 71 y.o. 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  She presents to the Survivorship Clinic for our initial meeting and routine follow-up post-completion of treatment for breast cancer.    1. Stage IA right breast cancer:  Maria Love is continuing to recover from definitive treatment for breast cancer. She will follow-up with her medical oncologist, Dr. Burr Medico in 06/2022 with history and physical exam per surveillance protocol.  She will continue her anti-estrogen therapy with Tamoxifen. Thus far, she is tolerating the Tamoxifen well, with minimal side effects.  Her mammogram is due 06/2022; orders placed today. Today, a comprehensive survivorship care plan and treatment summary was reviewed with the patient today detailing her breast cancer diagnosis, treatment course, potential late/long-term effects of treatment, appropriate follow-up care with recommendations for the future, and patient education resources.  A copy of this summary, along with a letter will be sent to the patient's primary care provider via mail/fax/In Basket message after today's visit.    2. Bone health:   Her bone density in 08/2021 demonstrated mild osteopenia.  I reviewed that tamoxifen is protective of the bones. She was also given education on specific activities to promote bone health.  3. Cancer screening:  Due to Maria Love's history  and her age, she should receive screening for skin cancers, colon cancer, and gynecologic cancers.  The information and recommendations are listed on the patient's comprehensive care plan/treatment summary and were reviewed in detail with the patient.    4. Health maintenance and wellness promotion: Maria Love was encouraged to consume 5-7 servings of fruits and vegetables per day. We reviewed the "Nutrition Rainbow" handout.  She was also encouraged to engage in moderate to vigorous exercise for 30 minutes per day most days of the week. We discussed the [employer] fitness program, which is designed for cancer survivors to help them become more physically fit after cancer treatments.  She was instructed to limit her alcohol consumption and continue to abstain from tobacco use.     5. Support services/counseling: It is not uncommon for this period of the patient's cancer care trajectory to be one of many emotions and stressors.   She was given information regarding our available services and encouraged to contact me with any questions or for help enrolling in any of our support group/programs.    Follow up instructions:    -Return to cancer center 06/2022  -Mammogram due in 06/2022 -Follow up with surgery 12/2021 -She is welcome to return back to the Survivorship Clinic at any time; no additional follow-up needed at this time.  -Consider referral back to survivorship as a long-term survivor for continued surveillance  The patient was provided an opportunity to ask questions and all were answered. The patient agreed with the plan and demonstrated an understanding of the instructions.   Total encounter time:30 minutes*in face-to-face visit time, chart review, lab review, care coordination, order entry, and documentation of the encounter time.    Wilber Bihari, NP 04/03/22 1:35 PM Medical Oncology and Hematology [facility] [place], [place] [address] Tel.  [phone]    Fax. [phone]  *Total Encounter Time as defined by the Centers for Medicare and Medicaid Services includes, in addition to the face-to-face time of a patient visit (documented in the note above) non-face-to-face time: obtaining and reviewing outside history, ordering and reviewing medications, tests or procedures, care coordination (communications with other health care professionals or  caregivers) and documentation in the medical record.

## 2022-04-11 ENCOUNTER — Ambulatory Visit
Admission: RE | Admit: 2022-04-11 | Discharge: 2022-04-11 | Disposition: A | Discharge status: Home / Self Care | Payer: Medicare Other | Source: Ambulatory Visit | Attending: Thoracic Surgery (Cardiothoracic Vascular Surgery) | Admitting: Thoracic Surgery (Cardiothoracic Vascular Surgery)

## 2022-04-11 DIAGNOSIS — R911 Solitary pulmonary nodule: Secondary | ICD-10-CM

## 2022-04-15 ENCOUNTER — Ambulatory Visit: Payer: Medicare Other | Admitting: Thoracic Surgery (Cardiothoracic Vascular Surgery)

## 2022-04-15 ENCOUNTER — Encounter: Payer: Self-pay | Admitting: Thoracic Surgery (Cardiothoracic Vascular Surgery)

## 2022-04-15 VITALS — BP 150/80 | HR 70 | Resp 20 | Ht 66.0 in | Wt 175.0 lb

## 2022-04-15 DIAGNOSIS — R911 Solitary pulmonary nodule: Secondary | ICD-10-CM | POA: Diagnosis not present

## 2022-04-15 NOTE — Progress Notes (Signed)
[address]       [place] [address]             [phone]     HPI: Maria Love returns for a follow-up of lung nodule incidentally noted on a CT  Maria Love is a 71 year old woman with history of hypertension, contact dermatitis, migraines, early stage breast cancer, and seasonal allergies.  She had a coronary CT in May 2022 which showed a 1.3 cm right lower lobe lung nodule.  There was central calcification and also calcified hilar and mediastinal lymph nodes.  43-monthscan was unchanged and she now returns for 620-monthollow-up.  She recently saw oncology and there are no active issues there.  She is on tamoxifen.  No chest pain, shortness of breath, wheezing, weight loss.  Past Medical History:  Diagnosis Date   Allergy 1973   Antihistamines   Arthritis 2003   [place]ast cancer ([place]/20[place]   Hypertension    Skin cancer 2006    Current Outpatient Medications  Medication Sig Dispense Refill   aspirin EC 81 MG tablet Take 1 tablet (81 mg total) by mouth daily. Swallow whole. 90 tablet 3   atorvastatin (LIPITOR) 40 MG tablet TAKE 1 TABLET BY MOUTH EVERY DAY 90 tablet 1   Coenzyme Q10 (COQ10) 100 MG CAPS Take 1 capsule by mouth daily.     metoprolol succinate (TOPROL-XL) 25 MG 24 hr tablet Take 1 tablet (25 mg total) by mouth daily. 90 tablet 1   NON FORMULARY Take 1 capsule by mouth daily. Vitamin K2 D3     tamoxifen (NOLVADEX) 20 MG tablet Take 1 tablet (20 mg total) by mouth daily. 30 tablet 3   No current facility-administered medications for this visit.    Physical Exam BP (!) 150/80   Pulse 70   Resp 20   Ht '5\' 6"'$  (1.676 m)   Wt 175 lb (79.4 kg)   LMP  (LMP Unknown)   SpO2 98% Comment: RA  BMI 28.2564g/m  [age]ear old woman in no acute distress Well-developed and well-nourished Alert and oriented x3 with no focal deficits Cardiac regular rate and rhythm Lungs clear with equal breath sounds bilaterally  Diagnostic Tests: CT  CHEST WITHOUT CONTRAST   TECHNIQUE: Multidetector CT imaging of the chest was performed following the standard protocol without IV contrast.   RADIATION DOSE REDUCTION: This exam was performed according to the departmental dose-optimization program which includes automated exposure control, adjustment of the mA and/or kV according to patient size and/or use of iterative reconstruction technique.   COMPARISON:  09/20/2021   FINDINGS: Cardiovascular: Coronary atherosclerotic calcification.   Mediastinum/Nodes: Calcified right infrahilar and subcarinal lymph nodes compatible with old granulomatous disease.   Lungs/Pleura: Chronic airway plugging in the right upper lobe on image 35 series 8, similar to prior.   1.5 by 1.3 cm right lower lobe pulmonary nodule on image 113 series 8 with the dense central calcification, no change from earliest available comparison of 02/19/2021. Given the dense central calcification and other findings of old granulomatous disease, this is most likely to be a benign pulmonary granuloma.   Chronic scarring in the lingula. Bridging airway plugging in the superior segment left lower lobe on image 68 series 8, no change from 02/19/2021.   Upper Abdomen: Unremarkable   Musculoskeletal: Clips noted in the right breast and axilla.   IMPRESSION: 1. Stable appearance of sites of airway plugging in both lungs, and stable appearance of the  1.5 by 1.3 cm right lower lobe pulmonary nodule which has a dense central calcification and which is probably a calcified granuloma. There also calcified infrahilar and right subcarinal lymph nodes compatible with old granulomatous disease. We have currently demonstrated 14 months of stability of the dominant nodule. I would suggest follow up noncontrast CT in 1 years time in order to further ensure the benign nature of this nodule. 2. Coronary atherosclerosis.     Electronically Signed   By: Van Clines M.D.    On: 04/11/2022 12:31 I personally reviewed the CT images.  There is no change in the right lower lobe pulmonary nodule most likely a granuloma given the central calcification.  Also calcified hilar and mediastinal lymph nodes.  Also some airway plugging.  No findings suspicious for malignancy.  Impression: Maria Love is a 71 year old woman with history of hypertension, contact dermatitis, migraines, early stage breast cancer, and seasonal allergies.   Right lower lobe lung nodule with calcification, calcified hilar and mediastinal lymph nodes-consistent with old granulomatous disease.  Since this was a new finding a year ago she should have 1 more CT scan in a year to ensure stability.  She informs me that she is moving to [place] next week.  She has been referred to an oncologist there.  She can discuss with them whether they would like to follow it or refer her to a thoracic surgeon there.  Coronary calcification on CT-had CT for coronary scoring a year ago.  No anginal symptoms.   Plan: Follow-up with oncologist in [place]. We will be happy to provide any information that might assist with her care.    Melrose Nakayama, MD [facility] [phone]

## 2022-04-20 ENCOUNTER — Other Ambulatory Visit: Payer: Self-pay | Admitting: Hematology

## 2022-04-22 ENCOUNTER — Ambulatory Visit: Payer: Medicare Other | Admitting: Thoracic Surgery (Cardiothoracic Vascular Surgery)
